# Patient Record
Sex: Male | Born: 1970
Health system: Southern US, Community
[De-identification: ages and names within clinical notes are randomized; demographics above are authoritative.]

## PROBLEM LIST (undated history)

## (undated) DIAGNOSIS — R931 Abnormal findings on diagnostic imaging of heart and coronary circulation: Secondary | ICD-10-CM

## (undated) DIAGNOSIS — E785 Hyperlipidemia, unspecified: Secondary | ICD-10-CM

## (undated) DIAGNOSIS — G4730 Sleep apnea, unspecified: Secondary | ICD-10-CM

## (undated) DIAGNOSIS — I10 Essential (primary) hypertension: Secondary | ICD-10-CM

## (undated) DIAGNOSIS — E119 Type 2 diabetes mellitus without complications: Secondary | ICD-10-CM

## (undated) DIAGNOSIS — E663 Overweight: Secondary | ICD-10-CM

## (undated) HISTORY — DX: Type 2 diabetes mellitus without complications: E11.9

## (undated) HISTORY — DX: Sleep apnea, unspecified: G47.30

## (undated) HISTORY — DX: Hyperlipidemia, unspecified: E78.5

## (undated) HISTORY — DX: Overweight: E66.3

## (undated) HISTORY — PX: FINGER SURGERY: SHX640

## (undated) HISTORY — PX: VASECTOMY: SHX75

## (undated) HISTORY — DX: Abnormal findings on diagnostic imaging of heart and coronary circulation: R93.1

---

## 2005-10-11 ENCOUNTER — Ambulatory Visit: Payer: Self-pay | Admitting: Internal Medicine

## 2005-10-18 ENCOUNTER — Ambulatory Visit: Payer: Self-pay | Admitting: Internal Medicine

## 2005-11-22 ENCOUNTER — Ambulatory Visit: Payer: Self-pay | Admitting: Internal Medicine

## 2006-01-21 ENCOUNTER — Ambulatory Visit: Payer: Self-pay | Admitting: Internal Medicine

## 2006-03-12 ENCOUNTER — Ambulatory Visit: Payer: Self-pay | Admitting: Internal Medicine

## 2006-08-05 ENCOUNTER — Ambulatory Visit: Payer: Self-pay | Admitting: Internal Medicine

## 2006-09-16 ENCOUNTER — Ambulatory Visit: Payer: Self-pay | Admitting: Internal Medicine

## 2007-05-02 DIAGNOSIS — I10 Essential (primary) hypertension: Secondary | ICD-10-CM

## 2010-03-13 ENCOUNTER — Emergency Department (HOSPITAL_COMMUNITY): Admission: EM | Admit: 2010-03-13 | Discharge: 2010-03-13 | Payer: Self-pay | Admitting: Emergency Medicine

## 2010-10-13 LAB — URINALYSIS, ROUTINE W REFLEX MICROSCOPIC
Bilirubin Urine: NEGATIVE
Specific Gravity, Urine: 1.028 (ref 1.005–1.030)
Urobilinogen, UA: 0.2 mg/dL (ref 0.0–1.0)

## 2010-10-13 LAB — DIFFERENTIAL
Lymphocytes Relative: 19 % (ref 12–46)
Lymphs Abs: 1.9 10*3/uL (ref 0.7–4.0)
Monocytes Relative: 8 % (ref 3–12)
Neutro Abs: 7.2 10*3/uL (ref 1.7–7.7)
Neutrophils Relative %: 72 % (ref 43–77)

## 2010-10-13 LAB — POCT CARDIAC MARKERS
Myoglobin, poc: 184 ng/mL (ref 12–200)
Troponin i, poc: <0.05 ng/mL (ref 0.00–0.09)

## 2010-10-13 LAB — POCT I-STAT, CHEM 8
BUN: 17 mg/dL (ref 6–23)
Chloride: 106 mEq/L (ref 96–112)
Creatinine, Ser: 1.1 mg/dL (ref 0.4–1.5)
Glucose, Bld: 87 mg/dL (ref 70–99)
HCT: 49 % (ref 39.0–52.0)
Hemoglobin: 16.7 g/dL (ref 13.0–17.0)
Potassium: 4.1 mEq/L (ref 3.5–5.1)
Sodium: 141 mEq/L (ref 135–145)

## 2010-10-13 LAB — CBC
HCT: 46.7 % (ref 39.0–52.0)
Hemoglobin: 16.2 g/dL (ref 13.0–17.0)
Platelets: 271 10*3/uL (ref 150–400)
RDW: 13 % (ref 11.5–15.5)

## 2011-03-28 ENCOUNTER — Other Ambulatory Visit: Payer: Self-pay | Admitting: Family Medicine

## 2011-03-28 DIAGNOSIS — R945 Abnormal results of liver function studies: Secondary | ICD-10-CM

## 2013-04-03 ENCOUNTER — Encounter: Payer: Self-pay | Admitting: Cardiology

## 2013-04-06 ENCOUNTER — Other Ambulatory Visit: Payer: Self-pay | Admitting: Family Medicine

## 2013-04-17 ENCOUNTER — Other Ambulatory Visit (HOSPITAL_COMMUNITY): Payer: Self-pay | Admitting: Family Medicine

## 2013-04-30 ENCOUNTER — Ambulatory Visit (HOSPITAL_COMMUNITY): Payer: 59

## 2013-05-01 ENCOUNTER — Encounter: Payer: Self-pay | Admitting: Cardiology

## 2013-05-01 ENCOUNTER — Ambulatory Visit (INDEPENDENT_AMBULATORY_CARE_PROVIDER_SITE_OTHER)
Admission: RE | Admit: 2013-05-01 | Discharge: 2013-05-01 | Disposition: A | Discharge status: Home / Self Care | Payer: Self-pay | Source: Ambulatory Visit | Attending: Cardiology | Admitting: Cardiology

## 2013-05-01 ENCOUNTER — Ambulatory Visit (INDEPENDENT_AMBULATORY_CARE_PROVIDER_SITE_OTHER): Payer: 59 | Admitting: Cardiology

## 2013-05-01 VITALS — BP 136/91 | HR 66 | Ht 69.0 in | Wt 253.0 lb

## 2013-05-01 DIAGNOSIS — R0609 Other forms of dyspnea: Secondary | ICD-10-CM

## 2013-05-01 DIAGNOSIS — R0600 Dyspnea, unspecified: Secondary | ICD-10-CM

## 2013-05-01 DIAGNOSIS — I10 Essential (primary) hypertension: Secondary | ICD-10-CM

## 2013-05-01 NOTE — Progress Notes (Signed)
HPI The patient has no prior cardiac history. However, he has a very strong family history of early onset coronary artery disease. I recently saw his mother post MI after knee surgery. He himself has not had any cardiovascular testing in the past.  And he is trying to be more active recently. He does some walking. He walks up an incline he will get short of breath. However, he does not describe chest pressure, neck or arm discomfort. He does not report palpitations, presyncope or syncope. He has had no PND or orthopnea. He has had no weight gain or edema and in fact he has lost weight through diet.  Not on File  Current Outpatient Prescriptions  Medication Sig Dispense Refill  . aspirin 81 MG tablet Take 81 mg by mouth daily.      Marland Kitchen atenolol (TENORMIN) 50 MG tablet Take 50 mg by mouth daily.      . Multiple Vitamins-Minerals (MULTIVITAMIN WITH MINERALS) tablet Take 1 tablet by mouth daily.      . pravastatin (PRAVACHOL) 10 MG tablet Take 10 mg by mouth daily.       No current facility-administered medications for this visit.    No past medical history on file.  No past surgical history on file.  No family history on file.  History   Social History  . Marital Status: Married    Spouse Name: N/A    Number of Children: N/A  . Years of Education: N/A   Occupational History  . Not on file.   Social History Main Topics  . Smoking status: Never Smoker   . Smokeless tobacco: Not on file  . Alcohol Use: Not on file  . Drug Use: Not on file  . Sexual Activity: Not on file   Other Topics Concern  . Not on file   Social History Narrative  . No narrative on file    ROS:  As stated in the HPI and negative for all other systems.  PHYSICAL EXAM BP 136/91  Pulse 66  Ht 5\' 9"  (1.753 m)  Wt 253 lb (114.76 kg)  BMI 37.34 kg/m2 GENERAL:  Well appearing HEENT:  Pupils equal round and reactive, fundi not visualized, oral mucosa unremarkable NECK:  No jugular venous distention,  waveform within normal limits, carotid upstroke brisk and symmetric, no bruits, no thyromegaly LYMPHATICS:  No cervical, inguinal adenopathy LUNGS:  Clear to auscultation bilaterally BACK:  No CVA tenderness CHEST:  Unremarkable HEART:  PMI not displaced or sustained,S1 and S2 within normal limits, no S3, no S4, no clicks, no rubs, no murmurs ABD:  Flat, positive bowel sounds normal in frequency in pitch, no bruits, no rebound, no guarding, no midline pulsatile mass, no hepatomegaly, no splenomegaly EXT:  2 plus pulses throughout, no edema, no cyanosis no clubbing SKIN:  No rashes no nodules NEURO:  Cranial nerves II through XII grossly intact, motor grossly intact throughout PSYCH:  Cognitively intact, oriented to person place and time   EKG:  Sinus rhythm, rate 66, LAD, intervals within normal limits, no acute ST-T wave changes.  LAFB.  05/01/2013  ASSESSMENT AND PLAN  DYSPNEA:  This is unlikely to represent obstructive coronary disease. However, he has significant cardiovascular risk factors. I will bring the patient back for a POET (Plain Old Exercise Test). This will allow me to screen for obstructive coronary disease, risk stratify and very importantly provide a prescription for exercise.  I would like to also bring him back for a coronary calcium  score to further risk stratify.  OVERWEIGHT:  We discussed this at length.  The patient understands the need to lose weight with diet and exercise. We have discussed specific strategies for this.  DYSLIPIDEMIA:   He will get me a copy of his lipid profile. We will treat based on the results above.

## 2013-05-01 NOTE — Patient Instructions (Addendum)
The current medical regimen is effective;  continue present plan and medications.  Your physician has requested that you have an exercise tolerance test. For further information please visit [email or web address] Please also follow instruction sheet, as given.  Cardiac CA scanning, (CAT scanning), is a noninvasive, special x-ray that produces cross-sectional images of the body using x-rays and a computer. CT scans help physicians diagnose and treat medical conditions. For some CT exams, a contrast material is used to enhance visibility in the area of the body being studied. CT scans provide greater clarity and reveal more details than regular x-ray exams.

## 2013-05-15 ENCOUNTER — Ambulatory Visit: Payer: Self-pay | Admitting: Cardiology

## 2013-06-05 ENCOUNTER — Encounter: Payer: Self-pay | Admitting: Nurse Practitioner

## 2013-06-05 ENCOUNTER — Ambulatory Visit (INDEPENDENT_AMBULATORY_CARE_PROVIDER_SITE_OTHER): Payer: 59 | Admitting: Nurse Practitioner

## 2013-06-05 VITALS — BP 145/99 | HR 73

## 2013-06-05 DIAGNOSIS — R0600 Dyspnea, unspecified: Secondary | ICD-10-CM

## 2013-06-05 DIAGNOSIS — R0609 Other forms of dyspnea: Secondary | ICD-10-CM

## 2013-06-05 DIAGNOSIS — I10 Essential (primary) hypertension: Secondary | ICD-10-CM

## 2013-06-05 NOTE — Patient Instructions (Signed)
See back in one year.

## 2013-06-05 NOTE — Progress Notes (Signed)
Exercise Treadmill Test  Pre-Exercise Testing Evaluation Rhythm: normal sinus  Rate: 60 bpm     Test  Exercise Tolerance Test Ordering MD: Angelina Sheriff, MD  Interpreting MD: Tyrone Sage, NP  Unique Test No: 1  Treadmill:  1  Indication for ETT: exertional dyspnea  Contraindication to ETT: No   Stress Modality: exercise - treadmill  Cardiac Imaging Performed: non   Protocol: standard Bruce - maximal  Max BP:  209/70  Max MPHR (bpm):  176 85% MPR (bpm):  150  MPHR obtained (bpm):  136 % MPHR obtained:  77%  Reached 85% MPHR (min:sec):  NA Total Exercise Time (min-sec):  10:30  Workload in METS:  12.5 Borg Scale: 16  Reason ETT Terminated:  patient's desire to stop    ST Segment Analysis At Rest: normal ST segments - no evidence of significant ST depression With Exercise: no evidence of significant ST depression  Other Information Arrhythmia:  No Angina during ETT:  absent (0) Quality of ETT:  non-diagnostic  ETT Interpretation:  normal - no evidence of ischemia by ST analysis  Comments: Patient presents today for routine GXT. Has had recent visit for dyspnea - positive FH for early CAD. He tells me that he is not having shortness of breath. No chest pain. Feels ok with no complaints.   Today the patient exercised on the standard Bruce protocol for a total of 10:30 minutes.  Good exercise tolerance.  Adequate blood pressure response.  Clinically negative for chest pain. Test was stopped due to bilateral calf pain. No chest pain. Not short of breath.  EKG negative for ischemia. No significant arrhythmia noted.  The test is felt to be non diagnostic due to target heart rate not being achieved (77% of predicted)  Recommendations: He has no EKG changes. He took his Atenolol yesterday morning. I think he is ok from our standpoint.  Would encourage him to continue CV risk factor modification.  See back in one year. He will be sending in lipids for Dr. Antoine Poche to review.   Patient  is agreeable to this plan and will call if any problems develop in the interim.   Rosalio Macadamia, RN, ANP-C [facility] [address] [place], [place]  [address]

## 2013-11-13 ENCOUNTER — Ambulatory Visit: Payer: 59 | Admitting: Dietician

## 2013-11-27 ENCOUNTER — Encounter: Payer: 59 | Attending: Family Medicine | Admitting: Dietician

## 2013-11-27 DIAGNOSIS — Z713 Dietary counseling and surveillance: Secondary | ICD-10-CM | POA: Insufficient documentation

## 2013-11-27 DIAGNOSIS — Z8249 Family history of ischemic heart disease and other diseases of the circulatory system: Secondary | ICD-10-CM | POA: Insufficient documentation

## 2013-11-27 DIAGNOSIS — E669 Obesity, unspecified: Secondary | ICD-10-CM | POA: Insufficient documentation

## 2013-11-27 DIAGNOSIS — E785 Hyperlipidemia, unspecified: Secondary | ICD-10-CM | POA: Insufficient documentation

## 2013-11-27 NOTE — Progress Notes (Signed)
  Medical Nutrition Therapy:  Appt start time: 1030 end time:  1130.   Assessment:  Primary concerns today:  Marcus Dickson is here today because he is interested in lifestyle change. Wants to be healthier and lose 50 pounds.  He has a family history of stroke, high BP, and heart attack. No male in his family has lived past age 45. Interested in preventative maintenance. 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 Very active at work. Non-smoker, rare drinker. Sleeps from 3:30 AM - 7:30 AM. Tries to keep a normal schedule. Married for 21 years, 2 children. Likes weight training over cardio.   Preferred Learning Style:   No preference indicated   Learning Readiness:  Ready  MEDICATIONS: see list   DIETARY INTAKE:  Loves sweets.   24-hr recall:  8:30 AM: 1-2 cups of coffee with vanilla creamer and 2 packets of sugar  2-1 PM: Bowl of soup or hummus with green olives on flatbread  7-10 PM: Chicken sandwiches or [place]key wrap from [employer] or fast food 2 AM: protein drink with Vanilla Almond milk OR soup or hummus  Beverages: coffee with cream and sugar, sweet tea or Marcus Dickson, soda sometimes  Usual physical activity: none  Estimated energy needs: 2200 calories 248 g carbohydrates 165 g protein 61 g fat  Progress Towards Goal(s):  No progress.   Nutritional Diagnosis:  [place]-2.2 Altered nutrition-related laboratory As related to obesity and inappropriate food choices.  As evidenced by hyperlipidemia.    Intervention:  Nutrition counseling provided. Goals: -Goal: lose 50 pounds  -Healthy rate of weight loss = 1-2 pound loss per week -Increase physical activity  -Walking/jogging (outside or treadmill) - listen to music that pumps you up! -Limit treats - Have them occasionally  -Avoid keeping them in the house!  -Have fruit to curb sweet cravings  -Include a carbohydrate and a protein with snacks and meals (refer to list) -Limit saturated fats: fatty cuts of meat, full-fat dairy, fried  foods, butter (shoot for <3g per serving) -Increase unsaturated fats: Olive oil, fish, nuts -Increase high fiber foods: fruits and vegetables and whole grains -Increase water intake (add fruit to flavor; cut up fruits ahead of time) -Choose low fat products (cheese, milk, sour cream, yogurt)  Teaching Method Utilized: Visual Auditory  Handouts given during visit include:  15g CHO + protein snacks  Barriers to learning/adherence to lifestyle change: none  Demonstrated degree of understanding via:  Teach Back   Monitoring/Evaluation:  Dietary intake, exercise, labs, and body weight in 3 month(s).

## 2013-11-27 NOTE — Patient Instructions (Addendum)
-  Goal: lose 50 pounds  -Healthy rate of weight loss = 1-2 pound loss per week  -Increase physical activity  -Walking/jogging (outside or treadmill) - listen to music that pumps you up!  -Limit treats - Have them occasionally  -Avoid keeping them in the house!  -Have fruit to curb sweet cravings   -Include a carbohydrate and a protein with snacks and meals (refer to list)  -Limit saturated fats: fatty cuts of meat, full-fat dairy, fried foods, butter (shoot for <3g per serving) -Increase unsaturated fats: Olive oil, fish, nuts  -Increase high fiber foods: fruits and vegetables and whole grains -Increase water intake (add fruit to flavor; cut up fruits ahead of time)  -Choose low fat products (cheese, milk, sour cream, yogurt)

## 2014-03-12 ENCOUNTER — Ambulatory Visit: Payer: 59 | Admitting: Dietician

## 2014-05-07 ENCOUNTER — Ambulatory Visit: Payer: 59 | Admitting: Dietician

## 2014-06-18 ENCOUNTER — Ambulatory Visit (INDEPENDENT_AMBULATORY_CARE_PROVIDER_SITE_OTHER): Payer: 59 | Admitting: Cardiology

## 2014-06-18 ENCOUNTER — Encounter: Payer: Self-pay | Admitting: Cardiology

## 2014-06-18 VITALS — BP 138/88 | HR 57 | Ht 69.0 in | Wt 258.0 lb

## 2014-06-18 DIAGNOSIS — E785 Hyperlipidemia, unspecified: Secondary | ICD-10-CM

## 2014-06-18 DIAGNOSIS — R938 Abnormal findings on diagnostic imaging of other specified body structures: Secondary | ICD-10-CM

## 2014-06-18 DIAGNOSIS — I2510 Atherosclerotic heart disease of native coronary artery without angina pectoris: Secondary | ICD-10-CM

## 2014-06-18 DIAGNOSIS — R931 Abnormal findings on diagnostic imaging of heart and coronary circulation: Secondary | ICD-10-CM

## 2014-06-18 LAB — LIPID PANEL
CHOL/HDL RATIO: 5.4 ratio
CHOLESTEROL: 174 mg/dL (ref 0–200)
HDL: 32 mg/dL — AB (ref >39)
LDL CALC: 99 mg/dL (ref 0–99)
Triglycerides: 217 mg/dL — ABNORMAL HIGH (ref <150)
VLDL: 43 mg/dL — ABNORMAL HIGH (ref 0–40)

## 2014-06-18 NOTE — Patient Instructions (Signed)
Your physician recommends that you schedule a follow-up appointment in: as needed  We are ordering  A treadmill test

## 2014-06-18 NOTE — Progress Notes (Signed)
   HPI The patient has a history of coronary calcium.  We found this on screening because he has a very strong family history of early onset coronary artery disease. He had a negative POET (Plain Old Exercise Treadmill).  After our first visit last year he did lose 30 pounds and exercise and started eating right. However, he had some backsliding and gained most of his weight back and hasn't been exercising as much. The patient denies any new symptoms such as chest discomfort, neck or arm discomfort. There has been no new shortness of breath, PND or orthopnea. There have been no reported palpitations, presyncope or syncope.   Not on File  Current Outpatient Prescriptions  Medication Sig Dispense Refill  . aspirin 81 MG tablet Take 81 mg by mouth daily.    Marland Kitchen. atenolol (TENORMIN) 50 MG tablet Take 50 mg by mouth daily.    . Multiple Vitamins-Minerals (MULTIVITAMIN WITH MINERALS) tablet Take 1 tablet by mouth daily.    . pravastatin (PRAVACHOL) 10 MG tablet Take 10 mg by mouth daily.     No current facility-administered medications for this visit.    Past Medical History  Diagnosis Date  . Dyslipidemia     Low HDL  . Overweight   . Dyspnea     Past Surgical History  Procedure Laterality Date  . Finger surgery    . Vasectomy      ROS:  As stated in the HPI and negative for all other systems.  PHYSICAL EXAM BP 138/88 mmHg  Pulse 57  Ht 5\' 9"  (1.753 m)  Wt 258 lb (117.028 kg)  BMI 38.08 kg/m2 GENERAL:  Well appearing NECK:  No jugular venous distention, waveform within normal limits, carotid upstroke brisk and symmetric, no bruits, no thyromegaly LUNGS:  Clear to auscultation bilaterally CHEST:  Unremarkable HEART:  PMI not displaced or sustained,S1 and S2 within normal limits, no S3, no S4, no clicks, no rubs, no murmurs ABD:  Flat, positive bowel sounds normal in frequency in pitch, no bruits, no rebound, no guarding, no midline pulsatile mass, no hepatomegaly, no splenomegaly,  obese EXT:  2 plus pulses throughout, no edema, no cyanosis no clubbing  EKG:  Sinus rhythm, rate 57, LAD, intervals within normal limits, no acute ST-T wave changes.  LAFB.  06/18/2014  ASSESSMENT AND PLAN  CORONARY CALCIUM:   I will bring the patient back for a POET (Plain Old Exercise Test). This will allow me to screen for obstructive coronary disease, risk stratify and very importantly provide a prescription for exercise.  I would like to also bring him back for a coronary calcium score to further risk stratify.  OVERWEIGHT:  We discussed this at length again today.  The patient understands the need to lose weight with diet and exercise. We have discussed specific strategies for this.  DYSLIPIDEMIA:    His LDL last year was slightly above 100.  I will repeat this with a goal LDL of 70.

## 2014-06-29 ENCOUNTER — Other Ambulatory Visit: Payer: Self-pay | Admitting: *Deleted

## 2014-07-01 ENCOUNTER — Other Ambulatory Visit: Payer: Self-pay | Admitting: *Deleted

## 2014-07-01 DIAGNOSIS — E785 Hyperlipidemia, unspecified: Secondary | ICD-10-CM

## 2014-07-01 MED ORDER — PRAVASTATIN SODIUM 40 MG PO TABS: 40.0000 mg | ORAL_TABLET | Freq: Every evening | ORAL | Status: DC

## 2014-07-14 ENCOUNTER — Telehealth (HOSPITAL_COMMUNITY): Payer: Self-pay

## 2014-07-14 NOTE — Telephone Encounter (Signed)
Encounter complete. 

## 2014-07-15 ENCOUNTER — Telehealth (HOSPITAL_COMMUNITY): Payer: Self-pay

## 2014-07-15 NOTE — Telephone Encounter (Signed)
Encounter complete. 

## 2014-07-16 ENCOUNTER — Encounter (HOSPITAL_COMMUNITY): Payer: 59

## 2014-08-03 ENCOUNTER — Telehealth (HOSPITAL_COMMUNITY): Payer: Self-pay | Admitting: *Deleted

## 2014-08-04 ENCOUNTER — Telehealth: Payer: Self-pay | Admitting: Cardiology

## 2014-08-04 NOTE — Telephone Encounter (Signed)
Pt called in wanting to see if he could come in to see Dr. Antoine PocheHochrein within the next 2 wks because he will be leaving to go out of town for work for 2 months. Please call  Thanks

## 2014-08-04 NOTE — Telephone Encounter (Signed)
Returned call to patient he stated he will be leaving town and would like appointment with Dr.Hochrein.Stated he had to cancel stress test appointment.Stated he needed to talk to Dr.Hochrein about some things.Appointment scheduled with Dr.Hochrein 08/09/14 at 12:15 pm.

## 2014-08-06 ENCOUNTER — Encounter (INDEPENDENT_AMBULATORY_CARE_PROVIDER_SITE_OTHER): Payer: Self-pay | Admitting: Ophthalmology

## 2014-08-09 ENCOUNTER — Ambulatory Visit: Payer: 59 | Admitting: Cardiology

## 2014-08-13 ENCOUNTER — Encounter (HOSPITAL_COMMUNITY): Payer: 59

## 2014-08-16 ENCOUNTER — Telehealth (HOSPITAL_COMMUNITY): Payer: Self-pay | Admitting: *Deleted

## 2014-08-27 ENCOUNTER — Other Ambulatory Visit (HOSPITAL_COMMUNITY): Payer: Self-pay | Admitting: Orthopaedic Surgery

## 2014-08-27 DIAGNOSIS — M25562 Pain in left knee: Secondary | ICD-10-CM

## 2014-09-04 ENCOUNTER — Ambulatory Visit (HOSPITAL_BASED_OUTPATIENT_CLINIC_OR_DEPARTMENT_OTHER): Payer: 59

## 2014-09-17 ENCOUNTER — Ambulatory Visit (INDEPENDENT_AMBULATORY_CARE_PROVIDER_SITE_OTHER): Payer: 59 | Admitting: Cardiology

## 2014-09-17 ENCOUNTER — Encounter: Payer: Self-pay | Admitting: Cardiology

## 2014-09-17 VITALS — BP 138/90 | HR 60 | Ht 69.0 in | Wt 255.0 lb

## 2014-09-17 DIAGNOSIS — E785 Hyperlipidemia, unspecified: Secondary | ICD-10-CM

## 2014-09-17 DIAGNOSIS — R0600 Dyspnea, unspecified: Secondary | ICD-10-CM

## 2014-09-17 DIAGNOSIS — R931 Abnormal findings on diagnostic imaging of heart and coronary circulation: Secondary | ICD-10-CM | POA: Insufficient documentation

## 2014-09-17 DIAGNOSIS — I2510 Atherosclerotic heart disease of native coronary artery without angina pectoris: Secondary | ICD-10-CM

## 2014-09-17 NOTE — Patient Instructions (Signed)
Your physician recommends that you schedule a follow-up appointment in: as needed with Dr. Antoine PocheHochrein  We are ordering lab test and a stress test for you to get done

## 2014-09-17 NOTE — Progress Notes (Signed)
   HPI The patient has a history of coronary calcium.  We found this on screening because he has a very strong family history of early onset coronary artery disease. He had a negative POET (Plain Old Exercise Treadmill) in 2014.  He was supposed to get this repeated recently but he had to cancel. He's not been having any new symptoms since I last saw him.Marland Kitchen.  He denies chest discomfort, neck or arm discomfort. There has been no new shortness of breath, PND or orthopnea. There have been no reported palpitations, presyncope or syncope.  He is doing some exercising. He says he is dieting and lost 8 pounds although not according to our scales.   Not on File  Current Outpatient Prescriptions  Medication Sig Dispense Refill  . aspirin 81 MG tablet Take 81 mg by mouth daily.    Marland Kitchen. atenolol (TENORMIN) 50 MG tablet Take 50 mg by mouth daily.    . Multiple Vitamins-Minerals (MULTIVITAMIN WITH MINERALS) tablet Take 1 tablet by mouth daily.    . pravastatin (PRAVACHOL) 40 MG tablet Take 1 tablet (40 mg total) by mouth every evening. 90 tablet 3   No current facility-administered medications for this visit.    Past Medical History  Diagnosis Date  . Dyslipidemia     Low HDL  . Overweight   . Dyspnea     Past Surgical History  Procedure Laterality Date  . Finger surgery    . Vasectomy      ROS:  ED:  Otherwise as stated in the HPI and negative for all other systems.  PHYSICAL EXAM BP 138/90 mmHg  Pulse 60  Ht 5\' 9"  (1.753 m)  Wt 255 lb (115.667 kg)  BMI 37.64 kg/m2 GENERAL:  Well appearing NECK:  No jugular venous distention, waveform within normal limits, carotid upstroke brisk and symmetric, no bruits, no thyromegaly LUNGS:  Clear to auscultation bilaterally CHEST:  Unremarkable HEART:  PMI not displaced or sustained,S1 and S2 within normal limits, no S3, no S4, no clicks, no rubs, no murmurs ABD:  Flat, positive bowel sounds normal in frequency in pitch, no bruits, no rebound, no  guarding, no midline pulsatile mass, no hepatomegaly, no splenomegaly, obese EXT:  2 plus pulses throughout, no edema, no cyanosis no clubbing  EKG:  Sinus rhythm, rate 60, LAD, intervals within normal limits, no acute ST-T wave changes.  LAFB.  09/17/2014  ASSESSMENT AND PLAN  CORONARY CALCIUM:   The patient has no active symptoms. He had to reschedule his POET (Plain Old Exercise Treadmill).  I will arrange this for next week.    OVERWEIGHT:  We weight loss again.    DYSLIPIDEMIA:    His LDL was 99.  This was before pravastatin. I will now repeat this. The goal is less than 70.  ED:  I would give him a prescription for Viagra once he has passed his stress test.

## 2014-09-22 ENCOUNTER — Telehealth (HOSPITAL_COMMUNITY): Payer: Self-pay

## 2014-09-22 NOTE — Telephone Encounter (Signed)
Encounter complete. 

## 2014-09-24 ENCOUNTER — Ambulatory Visit (HOSPITAL_COMMUNITY)
Admission: RE | Admit: 2014-09-24 | Discharge: 2014-09-24 | Disposition: A | Discharge status: Home / Self Care | Payer: 59 | Source: Ambulatory Visit | Attending: Cardiology | Admitting: Cardiology

## 2014-09-24 DIAGNOSIS — R0600 Dyspnea, unspecified: Secondary | ICD-10-CM | POA: Diagnosis not present

## 2014-09-24 NOTE — Procedures (Signed)
Exercise Treadmill Test  Test  Exercise Tolerance Test Ordering MD: Angelina SheriffJake Hochrein, MD  Interpreting MD:   Unique Test No: 1  Treadmill:  1  Indication for ZOX:[id number]:dyspnea Contraindication to ETT: No   Stress Modality: exercise - treadmill  Cardiac Imaging Performed: non   Protocol: standard Bruce - maximal  Max BP:  186/84  Max MPHR (bpm):  175 85% MPR (bpm):  148  MPHR obtained (bpm):  153 % MPHR obtained:  87  Reached 85% MPHR (min:sec):  9:50 Total Exercise Time (min-sec): 10:00  Workload in METS: 11.90 Borg Scale:   Reason ETT Terminated:  dyspnea, fatigue    ST Segment Analysis At Rest: NSR, right superior axis, no ST changes With Exercise: no evidence of significant ST depression  Other Information Arrhythmia:  No Angina during ETT:  absent (0) Quality of ETT:  diagnostic  ETT Interpretation:  normal - no evidence of ischemia by ST analysis  Comments: ETT with good exercise tolerance (10:00); no chest pain; normal BP response; no ST changes; Duke TM score-11; negative adequate ETT.  Marcus MillersBrian Dickson

## 2014-09-25 LAB — LIPID PANEL
CHOLESTEROL: 159 mg/dL (ref 0–200)
HDL: 30 mg/dL — AB (ref >=40)
LDL Cholesterol: 91 mg/dL (ref 0–99)
Total CHOL/HDL Ratio: 5.3 Ratio
Triglycerides: 191 mg/dL — ABNORMAL HIGH (ref <150)
VLDL: 38 mg/dL (ref 0–40)

## 2014-09-28 ENCOUNTER — Other Ambulatory Visit: Payer: Self-pay | Admitting: *Deleted

## 2014-09-28 ENCOUNTER — Telehealth: Payer: Self-pay | Admitting: Cardiology

## 2014-09-28 DIAGNOSIS — E785 Hyperlipidemia, unspecified: Secondary | ICD-10-CM

## 2014-09-28 DIAGNOSIS — R931 Abnormal findings on diagnostic imaging of heart and coronary circulation: Secondary | ICD-10-CM

## 2014-09-28 MED ORDER — SILDENAFIL CITRATE 50 MG PO TABS: 50.0000 mg | ORAL_TABLET | Freq: Every day | ORAL | Status: DC | PRN

## 2014-09-28 MED ORDER — PRAVASTATIN SODIUM 80 MG PO TABS: 40.0000 mg | ORAL_TABLET | Freq: Every evening | ORAL | Status: DC

## 2014-09-28 MED ORDER — SILDENAFIL CITRATE 50 MG PO TABS: 6.0000 mg | ORAL_TABLET | Freq: Every day | ORAL | Status: DC | PRN

## 2014-09-28 MED ORDER — PRAVASTATIN SODIUM 80 MG PO TABS: 80.0000 mg | ORAL_TABLET | Freq: Every evening | ORAL | Status: DC

## 2014-09-28 NOTE — Telephone Encounter (Signed)
I attempted to call pt. About his viagra script but his voice mail has not been set up

## 2014-09-28 NOTE — Telephone Encounter (Signed)
Pt called in wanting to check in with Dr. Antoine PocheHochrein about the Viagra medication he is suppose to call in for the pt. Please f/u  Thanks

## 2014-12-06 ENCOUNTER — Other Ambulatory Visit: Payer: Self-pay | Admitting: *Deleted

## 2014-12-06 ENCOUNTER — Other Ambulatory Visit: Payer: Self-pay | Admitting: Cardiology

## 2014-12-06 MED ORDER — ATENOLOL 50 MG PO TABS: 50.0000 mg | ORAL_TABLET | Freq: Every day | ORAL | Status: DC

## 2014-12-06 MED ORDER — PRAVASTATIN SODIUM 80 MG PO TABS: 80.0000 mg | ORAL_TABLET | Freq: Every evening | ORAL | Status: DC

## 2014-12-06 NOTE — Telephone Encounter (Signed)
Pt is working out of town for a week,left his medicine. Please call in 5 days of his Atenolol and Provastatin. Please call this to [facility]-[phone]

## 2014-12-06 NOTE — Telephone Encounter (Signed)
Marcus Dickson is calling again  Because he has no medication , Please call .Marland Kitchen. Thanks

## 2014-12-06 NOTE — Telephone Encounter (Signed)
Atenolol 50 mg # 5  No refills called into [facility] in [place] . Pt states he forgot his medication at home and just needed 5 days worth

## 2014-12-06 NOTE — Telephone Encounter (Signed)
Refill submitted to patient's preferred pharmacy. Informed patient. Pt voiced understanding, no other stated concerns at this time.  

## 2014-12-08 ENCOUNTER — Other Ambulatory Visit: Payer: Self-pay | Admitting: *Deleted

## 2014-12-08 ENCOUNTER — Telehealth: Payer: Self-pay | Admitting: Cardiology

## 2014-12-08 MED ORDER — ATENOLOL 50 MG PO TABS: 50.0000 mg | ORAL_TABLET | Freq: Two times a day (BID) | ORAL | Status: DC

## 2014-12-08 NOTE — Telephone Encounter (Signed)
Marcus Dickson is calling because we called in his bp medication to [facility] [place]  in the takesbp meds three times a day . ( Atenelol) and the pharmacy only gave him 5 pills for 5 days , but he needs 9 more pills until he gets home .( He left hi medication at home and he is out of town .Marland Kitchen. Thanks

## 2014-12-13 ENCOUNTER — Other Ambulatory Visit: Payer: Self-pay | Admitting: *Deleted

## 2015-06-10 ENCOUNTER — Other Ambulatory Visit: Payer: Self-pay | Admitting: Cardiology

## 2015-06-10 NOTE — Telephone Encounter (Signed)
°*  STAT* If patient is at the pharmacy, call can be transferred to refill team.   1. Which medications need to be refilled? (please list name of each medication and dose if known) Atenolol   2. Which pharmacy/location (including street and city if local pharmacy) is medication to be sent to?[facility]     3. Do they need a 30 day or 90 day supply? 90

## 2015-06-13 MED ORDER — ATENOLOL 50 MG PO TABS: 50.0000 mg | ORAL_TABLET | Freq: Two times a day (BID) | ORAL | Status: DC

## 2015-06-13 NOTE — Telephone Encounter (Signed)
Pt's medication has been sent to pt's pharmacy. Confirmation received. °

## 2015-06-17 ENCOUNTER — Telehealth: Payer: Self-pay | Admitting: Cardiology

## 2015-06-17 MED ORDER — ATENOLOL 50 MG PO TABS: 50.0000 mg | ORAL_TABLET | Freq: Two times a day (BID) | ORAL | Status: DC

## 2015-06-17 NOTE — Telephone Encounter (Signed)
Pt's Rx was resent to his pharmacy. Confirmation received.

## 2015-06-17 NOTE — Addendum Note (Signed)
Addended by: Demetrios LollBARNARD, CATHY C on: 06/17/2015 02:37 PM   Modules accepted: Orders

## 2015-06-17 NOTE — Telephone Encounter (Signed)
Pt's refill resent to pt's pharmacy. Confirmation received.

## 2015-06-17 NOTE — Telephone Encounter (Signed)
°*  STAT* If patient is at the pharmacy, call can be transferred to refill team.   1. Which medications need to be refilled? (please list name of each medication and dose if known) Atenolol-should have been 270  2. Which pharmacy/location (including street and city if local pharmacy) is medication to be sent to?[facility]  3. Do they need a 30 day or 90 day supply? Need 90 more for this refiil and the remainder refill should be for 270

## 2015-09-01 MED FILL — ATENOLOL 50 MG TABLET: 50 | 90 days supply | Qty: 270 | Fill #0

## 2015-09-01 MED FILL — PRAVASTATIN SODIUM 80 MG TA: 80 | 90 days supply | Qty: 90 | Fill #3

## 2015-09-09 MED FILL — MELOXICAM 15 MG TABLET: 15 | 30 days supply | Qty: 30 | Fill #3

## 2015-09-23 ENCOUNTER — Ambulatory Visit: Payer: 59 | Admitting: Cardiology

## 2015-10-13 NOTE — Progress Notes (Signed)
   HPI The patient has a history of coronary calcium.  We found this on screening because he has a very strong family history of early onset coronary artery disease. He had a negative POET (Plain Old Exercise Treadmill) in 2016.  Since I last saw him he has done well.  The patient denies any new symptoms such as chest discomfort, neck or arm discomfort. There has been no new shortness of breath, PND or orthopnea. There have been no reported palpitations, presyncope or syncope.  He has had a knee injury and has not been quite as active as I would like in the last year.   No Known Allergies  Current Outpatient Prescriptions  Medication Sig Dispense Refill  . aspirin 81 MG tablet Take 81 mg by mouth daily.    Marland Kitchen. atenolol (TENORMIN) 50 MG tablet Take 1 tablet (50 mg total) by mouth 2 (two) times daily. One in am and two in pm 270 tablet 0  . meloxicam (MOBIC) 15 MG tablet Take 1 tablet by mouth as needed.  5  . Multiple Vitamins-Minerals (MULTIVITAMIN WITH MINERALS) tablet Take 1 tablet by mouth daily.    . pravastatin (PRAVACHOL) 80 MG tablet Take 1 tablet (80 mg total) by mouth every evening. 5 tablet 0  . sildenafil (VIAGRA) 50 MG tablet Take 1 tablet (50 mg total) by mouth daily as needed for erectile dysfunction. 10 tablet 3   No current facility-administered medications for this visit.    Past Medical History  Diagnosis Date  . Dyslipidemia     Low HDL  . Overweight   . Dyspnea     Past Surgical History  Procedure Laterality Date  . Finger surgery    . Vasectomy      ROS:  ED:  Otherwise as stated in the HPI and negative for all other systems.  PHYSICAL EXAM BP 136/102 mmHg  Pulse 64  Ht 5\' 9"  (1.753 m)  Wt 255 lb 4.8 oz (115.803 kg)  BMI 37.68 kg/m2 GENERAL:  Well appearing NECK:  No jugular venous distention, waveform within normal limits, carotid upstroke brisk and symmetric, no bruits, no thyromegaly LUNGS:  Clear to auscultation bilaterally CHEST:   Unremarkable HEART:  PMI not displaced or sustained,S1 and S2 within normal limits, no S3, no S4, no clicks, no rubs, no murmurs ABD:  Flat, positive bowel sounds normal in frequency in pitch, no bruits, no rebound, no guarding, no midline pulsatile mass, no hepatomegaly, no splenomegaly, obese EXT:  2 plus pulses throughout, no edema, no cyanosis no clubbing  EKG:  Sinus rhythm, rate 64, LAD, intervals within normal limits, no acute ST-T wave changes.  LAFB.  10/14/2015  ASSESSMENT AND PLAN  CORONARY CALCIUM:   The patient has no active symptoms.  I will schedule a POET (Plain Old Exercise Treadmill).  Given the extent of his coronary calcium is family history we are following him closely.  OVERWEIGHT:  We weight loss again.    DYSLIPIDEMIA:   I did increase his pravastatin last year try and get his LDL to 70 mark. He will come back and have a fasting lipid profile.

## 2015-10-14 ENCOUNTER — Encounter: Payer: Self-pay | Admitting: Cardiology

## 2015-10-14 ENCOUNTER — Ambulatory Visit (INDEPENDENT_AMBULATORY_CARE_PROVIDER_SITE_OTHER): Payer: 59 | Admitting: Cardiology

## 2015-10-14 VITALS — BP 136/102 | HR 64 | Ht 69.0 in | Wt 255.3 lb

## 2015-10-14 DIAGNOSIS — I2510 Atherosclerotic heart disease of native coronary artery without angina pectoris: Secondary | ICD-10-CM

## 2015-10-14 DIAGNOSIS — R938 Abnormal findings on diagnostic imaging of other specified body structures: Secondary | ICD-10-CM

## 2015-10-14 DIAGNOSIS — R931 Abnormal findings on diagnostic imaging of heart and coronary circulation: Secondary | ICD-10-CM

## 2015-10-14 MED ORDER — PRAVASTATIN SODIUM 80 MG PO TABS: 80.0000 mg | ORAL_TABLET | Freq: Every evening | ORAL | Status: DC

## 2015-10-14 NOTE — Patient Instructions (Signed)
Your physician wants you to follow-up in: 1 Year. You will receive a reminder letter in the mail two months in advance. If you don't receive a letter, please call our office to schedule the follow-up appointment.  Your physician has requested that you have an exercise tolerance test in 2 Months. For further information please visit [email or web address]. Please also follow instruction sheet, as given.  Your physician recommends that you return for lab work in: in 2 Months Fasting Lipids

## 2015-11-28 MED FILL — PREVIDENT 5000 BOOSTER PLUS: 1.1 | 30 days supply | Qty: 100 | Fill #0

## 2015-12-02 ENCOUNTER — Other Ambulatory Visit: Payer: Self-pay | Admitting: Cardiology

## 2015-12-02 MED FILL — ATENOLOL 50 MG TABLET: 50 | 90 days supply | Qty: 270 | Fill #0

## 2015-12-02 NOTE — Telephone Encounter (Signed)
Rx Refill

## 2015-12-16 ENCOUNTER — Inpatient Hospital Stay (HOSPITAL_COMMUNITY): Admission: RE | Admit: 2015-12-16 | Payer: 59 | Source: Ambulatory Visit

## 2016-01-05 MED FILL — PRAVASTATIN SODIUM 80 MG TA: 80 | 90 days supply | Qty: 90 | Fill #0

## 2016-03-16 DIAGNOSIS — R05 Cough: Secondary | ICD-10-CM | POA: Diagnosis not present

## 2016-03-16 MED FILL — AZITHROMYCIN 250 MG TABLET: 250 | 5 days supply | Qty: 6 | Fill #0

## 2016-03-16 MED FILL — BENZONATATE 200 MG CAPSULE: 200 | 10 days supply | Qty: 30 | Fill #0

## 2016-03-19 ENCOUNTER — Other Ambulatory Visit: Payer: Self-pay | Admitting: Cardiology

## 2016-03-19 MED FILL — ATENOLOL 50 MG TABLET: 50 | 30 days supply | Qty: 90 | Fill #0

## 2016-04-06 ENCOUNTER — Inpatient Hospital Stay (HOSPITAL_COMMUNITY): Admission: RE | Admit: 2016-04-06 | Payer: 59 | Source: Ambulatory Visit

## 2016-04-06 ENCOUNTER — Encounter (HOSPITAL_COMMUNITY): Payer: 59

## 2016-04-17 MED FILL — BENZONATATE 200 MG CAPSULE: 200 | 10 days supply | Qty: 30 | Fill #1

## 2016-04-23 MED FILL — ATENOLOL 50 MG TABLET: 50 | 30 days supply | Qty: 90 | Fill #1

## 2016-04-23 MED FILL — PRAVASTATIN SODIUM 80 MG TA: 80 | 90 days supply | Qty: 90 | Fill #1

## 2016-04-25 ENCOUNTER — Telehealth (HOSPITAL_COMMUNITY): Payer: Self-pay

## 2016-04-25 NOTE — Telephone Encounter (Signed)
Encounter complete. 

## 2016-04-27 ENCOUNTER — Ambulatory Visit (HOSPITAL_COMMUNITY)
Admission: RE | Admit: 2016-04-27 | Discharge: 2016-04-27 | Disposition: A | Discharge status: Home / Self Care | Payer: 59 | Source: Ambulatory Visit | Attending: Cardiology | Admitting: Cardiology

## 2016-04-27 ENCOUNTER — Telehealth: Payer: Self-pay | Admitting: Cardiology

## 2016-04-27 ENCOUNTER — Encounter: Payer: Self-pay | Admitting: Cardiovascular Disease

## 2016-04-27 DIAGNOSIS — R938 Abnormal findings on diagnostic imaging of other specified body structures: Secondary | ICD-10-CM | POA: Diagnosis not present

## 2016-04-27 DIAGNOSIS — I2510 Atherosclerotic heart disease of native coronary artery without angina pectoris: Secondary | ICD-10-CM

## 2016-04-27 DIAGNOSIS — R931 Abnormal findings on diagnostic imaging of heart and coronary circulation: Secondary | ICD-10-CM | POA: Insufficient documentation

## 2016-04-27 LAB — EXERCISE TOLERANCE TEST
CHL CUP MPHR: 174 {beats}/min
CHL CUP RESTING HR STRESS: 76 {beats}/min
CSEPEDS: 18 s
CSEPHR: 17 %
Estimated workload: 10.6 METS
Exercise duration (min): 9 min
Peak HR: 133 {beats}/min
RPE: 76

## 2016-04-27 MED ORDER — ATENOLOL 50 MG PO TABS: ORAL_TABLET | ORAL | 1 refills | Status: DC

## 2016-04-27 NOTE — Telephone Encounter (Signed)
Returned call and refilled med at pt request. No further concerns or needs at this time.

## 2016-04-27 NOTE — Telephone Encounter (Signed)
Patient is having problems with prescriptions for atenolol and pravastation.

## 2016-04-30 ENCOUNTER — Telehealth: Payer: Self-pay | Admitting: *Deleted

## 2016-04-30 DIAGNOSIS — R931 Abnormal findings on diagnostic imaging of heart and coronary circulation: Secondary | ICD-10-CM

## 2016-04-30 NOTE — Telephone Encounter (Signed)
GXT ordered for 1 Year

## 2016-04-30 NOTE — Telephone Encounter (Signed)
-----   Message from Rollene RotundaJames Hochrein, MD sent at 04/28/2016  5:25 PM EDT ----- No evidence of ischemia although this is a submaximal test.  He needs to continue his current therapy and see me in one year for repeat POET (Plain Old Exercise Treadmill).  Call [name] with the results and send results to Emeterio ReeveWOLTERS,SHARON A, MD

## 2016-06-20 MED FILL — ATENOLOL 50 MG TABLET: 50 | 30 days supply | Qty: 90 | Fill #2

## 2016-07-13 ENCOUNTER — Encounter (INDEPENDENT_AMBULATORY_CARE_PROVIDER_SITE_OTHER): Payer: 59 | Admitting: Ophthalmology

## 2016-07-13 ENCOUNTER — Encounter: Payer: Self-pay | Admitting: Physician Assistant

## 2016-07-13 ENCOUNTER — Telehealth: Payer: Self-pay | Admitting: Cardiology

## 2016-07-13 ENCOUNTER — Ambulatory Visit (INDEPENDENT_AMBULATORY_CARE_PROVIDER_SITE_OTHER): Payer: 59 | Admitting: Physician Assistant

## 2016-07-13 VITALS — BP 114/86 | HR 73 | Temp 98.3°F | Resp 18

## 2016-07-13 DIAGNOSIS — R7309 Other abnormal glucose: Secondary | ICD-10-CM

## 2016-07-13 DIAGNOSIS — E1165 Type 2 diabetes mellitus with hyperglycemia: Secondary | ICD-10-CM | POA: Diagnosis not present

## 2016-07-13 DIAGNOSIS — IMO0001 Reserved for inherently not codable concepts without codable children: Secondary | ICD-10-CM

## 2016-07-13 DIAGNOSIS — Z8249 Family history of ischemic heart disease and other diseases of the circulatory system: Secondary | ICD-10-CM

## 2016-07-13 DIAGNOSIS — R631 Polydipsia: Secondary | ICD-10-CM

## 2016-07-13 DIAGNOSIS — H35033 Hypertensive retinopathy, bilateral: Secondary | ICD-10-CM

## 2016-07-13 DIAGNOSIS — I10 Essential (primary) hypertension: Secondary | ICD-10-CM

## 2016-07-13 LAB — POCT CBC
Granulocyte percent: 61.2 %G (ref 37–80)
HCT, POC: 42.6 % — AB (ref 43.5–53.7)
HEMOGLOBIN: 15.5 g/dL (ref 14.1–18.1)
LYMPH, POC: 2.4 (ref 0.6–3.4)
MCH, POC: 29.5 pg (ref 27–31.2)
MCHC: 36.4 g/dL — AB (ref 31.8–35.4)
MCV: 81.2 fL (ref 80–97)
MID (cbc): 0.2 (ref 0–0.9)
MPV: 8.3 fL (ref 0–99.8)
POC Granulocyte: 4.2 (ref 2–6.9)
POC LYMPH PERCENT: 35.4 %L (ref 10–50)
POC MID %: 3.4 %M (ref 0–12)
Platelet Count, POC: 202 10*3/uL (ref 142–424)
RBC: 5.25 M/uL (ref 4.69–6.13)
RDW, POC: 12.6 %
WBC: 6.9 10*3/uL (ref 4.6–10.2)

## 2016-07-13 LAB — POCT GLYCOSYLATED HEMOGLOBIN (HGB A1C): Hemoglobin A1C: 12.9

## 2016-07-13 LAB — POCT URINALYSIS DIP (MANUAL ENTRY)
BILIRUBIN UA: NEGATIVE
Bilirubin, UA: NEGATIVE
Blood, UA: NEGATIVE
Glucose, UA: 500 — AB
LEUKOCYTES UA: NEGATIVE
Nitrite, UA: NEGATIVE
PH UA: 6
PROTEIN UA: NEGATIVE
SPEC GRAV UA: 1.01
UROBILINOGEN UA: 0.2

## 2016-07-13 LAB — GLUCOSE, POCT (MANUAL RESULT ENTRY)
POC GLUCOSE: 382 mg/dL — AB (ref 70–99)
POC GLUCOSE: 328 mg/dL — AB (ref 70–99)

## 2016-07-13 MED ORDER — GLIPIZIDE 5 MG PO TABS: 5.0000 mg | ORAL_TABLET | Freq: Two times a day (BID) | ORAL | 3 refills | Status: DC

## 2016-07-13 MED ORDER — METFORMIN HCL 1000 MG PO TABS: ORAL_TABLET | ORAL | 3 refills | Status: DC

## 2016-07-13 MED FILL — metFORMIN HCL 1000 MG TABS: 1000 | 90 days supply | Qty: 180 | Fill #0

## 2016-07-13 MED FILL — glipiZIDE 5 MG TABS: 5 | 30 days supply | Qty: 60 | Fill #0

## 2016-07-13 NOTE — Telephone Encounter (Signed)
Returned patient call-pt states he would like blood work ordered prior to appt on 12/26.  Pt reports "changes" in the last 2 weeks and is concerned.  Pt reports intermittent blurred vision, urinary frequency, increased thirst.  Pt concerned about diabetes, reports family hx of.  Advised patient to check CBG if able-unsure if wife has glucometer but will check blood sugar if able, advised if symptoms continue or increase then proceed to PCP or urgent care/ER to be evaluated.    Advised that message sent to MD Hochrein for lab orders if needed for scheduled OV on 12/26 and will return call.   Routed to MD.  Pt aware and verbalized understanding.

## 2016-07-13 NOTE — Patient Instructions (Addendum)
   IF you received an x-ray today, you will receive an invoice from [facility]. Please contact [facility] at [phone] with questions or concerns regarding your invoice.   IF you received labwork today, you will receive an invoice from [facility]. Please contact [facility] at [phone] with questions or concerns regarding your invoice.   Our billing staff will not be able to assist you with questions regarding bills from these companies.  You will be contacted with the lab results as soon as they are available. The fastest way to get your results is to activate your My Chart account. Instructions are located on the last page of this paperwork. If you have not heard from us regarding the results in 2 weeks, please contact this office.     Diabetes Mellitus and Food It is important for you to manage your blood sugar (glucose) level. Your blood glucose level can be greatly affected by what you eat. Eating healthier foods in the appropriate amounts throughout the day at about the same time each day will help you control your blood glucose level. It can also help slow or prevent worsening of your diabetes mellitus. Healthy eating may even help you improve the level of your blood pressure and reach or maintain a healthy weight. General recommendations for healthful eating and cooking habits include:  Eating meals and snacks regularly. Avoid going long periods of time without eating to lose weight.  Eating a diet that consists mainly of plant-based foods, such as fruits, vegetables, nuts, legumes, and whole grains.  Using low-heat cooking methods, such as baking, instead of high-heat cooking methods, such as deep frying.  Work with your dietitian to make sure you understand how to use the Nutrition Facts information on food labels. How can food affect me? Carbohydrates Carbohydrates affect your blood glucose level more than any other type of food. Your dietitian will help  you determine how many carbohydrates to eat at each meal and teach you how to count carbohydrates. Counting carbohydrates is important to keep your blood glucose at a healthy level, especially if you are using insulin or taking certain medicines for diabetes mellitus. Alcohol Alcohol can cause sudden decreases in blood glucose (hypoglycemia), especially if you use insulin or take certain medicines for diabetes mellitus. Hypoglycemia can be a life-threatening condition. Symptoms of hypoglycemia (sleepiness, dizziness, and disorientation) are similar to symptoms of having too much alcohol. If your health care provider has given you approval to drink alcohol, do so in moderation and use the following guidelines:  Women should not have more than one drink per day, and men should not have more than two drinks per day. One drink is equal to: ? 12 oz of beer. ? 5 oz of wine. ? 1 oz of hard liquor.  Do not drink on an empty stomach.  Keep yourself hydrated. Have water, diet soda, or unsweetened iced tea.  Regular soda, juice, and other mixers might contain a lot of carbohydrates and should be counted.  What foods are not recommended? As you make food choices, it is important to remember that all foods are not the same. Some foods have fewer nutrients per serving than other foods, even though they might have the same number of calories or carbohydrates. It is difficult to get your body what it needs when you eat foods with fewer nutrients. Examples of foods that you should avoid that are high in calories and carbohydrates but low in nutrients include:  Trans fats (most processed   foods list trans fats on the Nutrition Facts label).  Regular soda.  Juice.  Candy.  Sweets, such as cake, pie, doughnuts, and cookies.  Fried foods.  What foods can I eat? Eat nutrient-rich foods, which will nourish your body and keep you healthy. The food you should eat also will depend on several factors,  including:  The calories you need.  The medicines you take.  Your weight.  Your blood glucose level.  Your blood pressure level.  Your cholesterol level.  You should eat a variety of foods, including:  Protein. ? Lean cuts of meat. ? Proteins low in saturated fats, such as fish, egg whites, and beans. Avoid processed meats.  Fruits and vegetables. ? Fruits and vegetables that may help control blood glucose levels, such as apples, mangoes, and yams.  Dairy products. ? Choose fat-free or low-fat dairy products, such as milk, yogurt, and cheese.  Grains, bread, pasta, and rice. ? Choose whole grain products, such as multigrain bread, whole oats, and brown rice. These foods may help control blood pressure.  Fats. ? Foods containing healthful fats, such as nuts, avocado, olive oil, canola oil, and fish.  Does everyone with diabetes mellitus have the same meal plan? Because every person with diabetes mellitus is different, there is not one meal plan that works for everyone. It is very important that you meet with a dietitian who will help you create a meal plan that is just right for you. This information is not intended to replace advice given to you by your health care provider. Make sure you discuss any questions you have with your health care provider. Document Released: 04/12/2005 Document Revised: 12/22/2015 Document Reviewed: 06/12/2013 Elsevier Interactive Patient Education  2017 Elsevier Inc.  

## 2016-07-13 NOTE — Progress Notes (Signed)
07/14/2016 10:38 AM   DOB: [birth year] / MRN: [id number]  SUBJECTIVE:  Marcus Dickson is a 45 y.o. male presenting for for an elevated blood sugar.  reoperts for the last 2 weeks he has felt dehydrated and has been experiencing polyuria.  He associates excessive thirst. Denies any pain in his hands and feet.  Has been having some blurry vision. He has seen his cardiologist in the last week and everything was normal and this include a stress test and EKG.  Went to the eye doctor today and he blood sugar was check at 450.  He came straight from there to here.    Current Outpatient Prescriptions:  .  aspirin 81 MG tablet, Take 81 mg by mouth daily., Disp: , Rfl:  .  atenolol (TENORMIN) 50 MG tablet, TAKE 1 TABLET BY MOUTH EVERY MORNING AND 2 TABLETS EVERY EVENING, Disp: 270 tablet, Rfl: 1 .  meloxicam (MOBIC) 15 MG tablet, Take 1 tablet by mouth as needed., Disp: , Rfl: 5 .  Multiple Vitamins-Minerals (MULTIVITAMIN WITH MINERALS) tablet, Take 1 tablet by mouth daily., Disp: , Rfl:  .  pravastatin (PRAVACHOL) 80 MG tablet, Take 1 tablet (80 mg total) by mouth every evening., Disp: 90 tablet, Rfl: 3 .  sildenafil (VIAGRA) 50 MG tablet, Take 1 tablet (50 mg total) by mouth daily as needed for erectile dysfunction., Disp: 10 tablet, Rfl: 3  He has No Known Allergies.   He  has a past medical history of Dyslipidemia; High coronary artery calcium score; and Overweight.    He  reports that he has never smoked. He does not have any smokeless tobacco history on file. He reports that he does not drink alcohol or use drugs. He  reports that he currently engages in sexual activity. The patient  has a past surgical history that includes Finger surgery and Vasectomy.  His family history includes CAD (age of onset: 50) in his father; CAD (age of onset: 4) in his mother; Heart attack in his father and mother; Stroke in his father and paternal grandmother.  Review of Systems  Constitutional: Negative for  chills and fever.  Respiratory: Negative for shortness of breath.   Cardiovascular: Negative for chest pain and leg swelling.  Skin: Negative for itching and rash.  Neurological: Negative for dizziness.  Endo/Heme/Allergies: Negative for polydipsia.    The problem list and medications were reviewed and updated by myself where necessary and exist elsewhere in the encounter.   OBJECTIVE:  BP 114/86 (BP Location: Right Arm, Cuff Size: Large)   Pulse 73   Temp 98.3 F (36.8 C) (Oral)   Resp 18   SpO2 96%   Physical Exam  Constitutional: He is oriented to person, place, and time.  Cardiovascular: Normal rate and regular rhythm.   Pulmonary/Chest: Effort normal and breath sounds normal.  Abdominal: Soft. Bowel sounds are normal.  Musculoskeletal: Normal range of motion.  Neurological: He is alert and oriented to person, place, and time.  Skin: Skin is warm and dry.    Wt Readings from Last 3 Encounters:  10/14/15 255 lb 4.8 oz (115.8 kg)  09/17/14 255 lb (115.7 kg)  06/18/14 258 lb (117 kg)     Results for orders placed or performed in visit on 07/13/16 (from the past 72 hour(s))  POCT glucose (manual entry)     Status: Abnormal   Collection Time: 07/13/16  3:20 PM  Result Value Ref Range   POC Glucose 382 (A) 70 - 99 mg/dl  POCT urinalysis dipstick     Status: Abnormal   Collection Time: 07/13/16  3:32 PM  Result Value Ref Range   Color, UA yellow yellow   Clarity, UA clear clear   Glucose, UA =500 (A) negative   Bilirubin, UA negative negative   Ketones, POC UA negative negative   Spec Grav, UA 1.010    Blood, UA negative negative   pH, UA 6.0    Protein Ur, POC negative negative   Urobilinogen, UA 0.2    Nitrite, UA Negative Negative   Leukocytes, UA Negative Negative  POCT CBC     Status: Abnormal   Collection Time: 07/13/16  4:04 PM  Result Value Ref Range   WBC 6.9 4.6 - 10.2 K/uL   Lymph, poc 2.4 0.6 - 3.4   POC LYMPH PERCENT 35.4 10 - 50 %L   MID (cbc)  0.2 0 - 0.9   POC MID % 3.4 0 - 12 %M   POC Granulocyte 4.2 2 - 6.9   Granulocyte percent 61.2 37 - 80 %G   RBC 5.25 4.69 - 6.13 M/uL   Hemoglobin 15.5 14.1 - 18.1 g/dL   HCT, POC 42.6 (A) 43.5 - 53.7 %   MCV 81.2 80 - 97 fL   MCH, POC 29.5 27 - 31.2 pg   MCHC 36.4 (A) 31.8 - 35.4 g/dL   RDW, POC 12.6 %   Platelet Count, POC 202 142 - 424 K/uL   MPV 8.3 0 - 99.8 fL  POCT glycosylated hemoglobin (Hb A1C)     Status: None   Collection Time: 07/13/16  4:07 PM  Result Value Ref Range   Hemoglobin A1C 12.9   CMP14+EGFR     Status: Abnormal   Collection Time: 07/13/16  4:09 PM  Result Value Ref Range   Glucose 400 (H) 65 - 99 mg/dL   BUN 21 6 - 24 mg/dL   Creatinine, Ser 0.79 0.76 - 1.27 mg/dL   GFR calc non Af Amer 107 >59 mL/min/1.73   GFR calc Af Amer 124 >59 mL/min/1.73   BUN/Creatinine Ratio 27 (H) 9 - 20   Sodium 135 134 - 144 mmol/L   Potassium 4.5 3.5 - 5.2 mmol/L   Chloride 97 96 - 106 mmol/L   CO2 17 (L) 18 - 29 mmol/L   Calcium 9.6 8.7 - 10.2 mg/dL   Total Protein 7.5 6.0 - 8.5 g/dL   Albumin 4.4 3.5 - 5.5 g/dL   Globulin, Total 3.1 1.5 - 4.5 g/dL   Albumin/Globulin Ratio 1.4 1.2 - 2.2   Bilirubin Total 0.4 0.0 - 1.2 mg/dL   Alkaline Phosphatase 94 39 - 117 IU/L   AST 93 (H) 0 - 40 IU/L   ALT 137 (H) 0 - 44 IU/L  POCT glucose (manual entry)     Status: Abnormal   Collection Time: 07/13/16  4:51 PM  Result Value Ref Range   POC Glucose 328 (A) 70 - 99 mg/dl    No results found.  ASSESSMENT AND PLAN  Marcus Dickson was seen today for diabetes.  Diagnoses and all orders for this visit:  Elevated glucose: New onset diabetic presumed to be type 2.  1.6 liters of fluid and his sugar came down to 275.  Starting metformin and glipizide today.  Fasting blood sugar checks in the morning and anytime he is symptomatic.  Recheck in two weeks. ED if symptoms and sugar too high.   -     POCT urinalysis dipstick -  POCT glucose (manual entry) -     POCT CBC -     POCT glucose  (manual entry)  Excessive thirst -     CMP14+EGFR -     POCT glycosylated hemoglobin (Hb A1C)  Family history of early CAD: Will forward this note to his cardiologist Dr. Jenkins Rouge.    Uncontrolled type 2 diabetes mellitus without complication, without long-term current use of insulin (HCC) -     Insulin and C-Peptide  Diabetes Mellitus Type II: -     metFORMIN (GLUCOPHAGE) 1000 MG tablet; Take 1/2 tab in the morning and at night with food for one week.  After one week start taking a full tab in the morning and at night. -     glipiZIDE (GLUCOTROL) 5 MG tablet; Take 1 tablet (5 mg total) by mouth 2 (two) times daily before a meal.    The patient is advised to call or return to clinic if he does not see an improvement in symptoms, or to seek the care of the closest emergency department if he worsens with the above plan.   Philis Fendt, MHS, PA-C [facility] 07/14/2016 10:38 AM

## 2016-07-13 NOTE — Telephone Encounter (Signed)
New message  Pt call requesting to speak with RN. Pt states he would like to get an order in the system for lab work to complete before office visit on 12/26. Please call back to discuss

## 2016-07-13 NOTE — Telephone Encounter (Signed)
Returned call to patient-patient requested RN to speak to wife.  Wife reports she took her husbands blood sugar and it is 450.  Wife requesting for opthalmologist   (who she works for and took her husband to check CBG) to speak with Hochrein if able.    Spoke with MD Hochrein (DOD).  Unable to speak to opthalmologist at this time, advised to proceed to urgent care or ER.  Pt does not have PCP.  Wife verbalized understanding and will proceed to ER for evaluation.

## 2016-07-13 NOTE — Telephone Encounter (Signed)
Mr.Del is calling to give you his blood sugars . Please call

## 2016-07-14 LAB — CMP14+EGFR
A/G RATIO: 1.4 (ref 1.2–2.2)
ALT: 137 IU/L — ABNORMAL HIGH (ref 0–44)
AST: 93 IU/L — ABNORMAL HIGH (ref 0–40)
Albumin: 4.4 g/dL (ref 3.5–5.5)
Alkaline Phosphatase: 94 IU/L (ref 39–117)
BUN / CREAT RATIO: 27 — AB (ref 9–20)
BUN: 21 mg/dL (ref 6–24)
Bilirubin Total: 0.4 mg/dL (ref 0.0–1.2)
CALCIUM: 9.6 mg/dL (ref 8.7–10.2)
CO2: 17 mmol/L — ABNORMAL LOW (ref 18–29)
Chloride: 97 mmol/L (ref 96–106)
Creatinine, Ser: 0.79 mg/dL (ref 0.76–1.27)
GFR, EST AFRICAN AMERICAN: 124 mL/min/{1.73_m2} (ref >59)
GFR, EST NON AFRICAN AMERICAN: 107 mL/min/{1.73_m2} (ref >59)
GLOBULIN, TOTAL: 3.1 g/dL (ref 1.5–4.5)
Glucose: 400 mg/dL — ABNORMAL HIGH (ref 65–99)
POTASSIUM: 4.5 mmol/L (ref 3.5–5.2)
SODIUM: 135 mmol/L (ref 134–144)
TOTAL PROTEIN: 7.5 g/dL (ref 6.0–8.5)

## 2016-07-14 LAB — INSULIN AND C-PEPTIDE, SERUM
C-Peptide: 7.6 ng/mL — ABNORMAL HIGH (ref 1.1–4.4)
INSULIN: 54.5 u[IU]/mL — ABNORMAL HIGH (ref 2.6–24.9)

## 2016-07-16 ENCOUNTER — Telehealth: Payer: Self-pay

## 2016-07-16 DIAGNOSIS — R945 Abnormal results of liver function studies: Principal | ICD-10-CM

## 2016-07-16 DIAGNOSIS — R7989 Other specified abnormal findings of blood chemistry: Secondary | ICD-10-CM

## 2016-07-16 NOTE — Telephone Encounter (Signed)
Please send labs and ov notes to Dr. Ardyth HarpsSteve South   attn: Karoline Caldwellngie   Wife states her husband is responding well to the medication.  Thank you Deliah BostonMichael Clark for calling and checking on him.    Fax [phone]  [phone]

## 2016-07-18 ENCOUNTER — Telehealth: Payer: Self-pay

## 2016-07-18 DIAGNOSIS — E119 Type 2 diabetes mellitus without complications: Secondary | ICD-10-CM

## 2016-07-18 NOTE — Telephone Encounter (Signed)
Pt is in link to wellness program and will be in need of a referral to the nutrition diabetes education services    Please advise  [facility] to this Iverson AlaminLaura Greeson is the one calling if more info is needed you may call her at [phone]

## 2016-07-23 NOTE — Progress Notes (Signed)
HPI The patient has a history of coronary calcium.  We found this on screening because he has a very strong family history of early onset coronary artery disease. He had a negative POET (Plain Old Exercise Treadmill) in 2016.  He had a negative POET (Plain Old Exercise Treadmill) again this year but this was submaximal.  He returns for follow up.  Since I last saw him he has been diagnosed with DM.  I reviewed the labs and his  insulin level was significantly elevated. He also had elevated liver enzymes. He is now on Glucophage and glipizide. He denies any cardiovascular symptoms. He has changed his diet. The patient denies any new symptoms such as chest discomfort, neck or arm discomfort. There has been no new shortness of breath, PND or orthopnea. There have been no reported palpitations, presyncope or syncope.   No Known Allergies  Current Outpatient Prescriptions  Medication Sig Dispense Refill  . aspirin 81 MG tablet Take 81 mg by mouth daily.    Marland Kitchen. atenolol (TENORMIN) 50 MG tablet TAKE 1 TABLET BY MOUTH EVERY MORNING AND 2 TABLETS EVERY EVENING 270 tablet 1  . glipiZIDE (GLUCOTROL) 5 MG tablet Take 1 tablet (5 mg total) by mouth 2 (two) times daily before a meal. 60 tablet 3  . meloxicam (MOBIC) 15 MG tablet Take 1 tablet by mouth as needed.  5  . metFORMIN (GLUCOPHAGE) 1000 MG tablet Take 1/2 tab in the morning and at night with food for one week.  After one week start taking a full tab in the morning and at night. 180 tablet 3  . Multiple Vitamins-Minerals (MULTIVITAMIN WITH MINERALS) tablet Take 1 tablet by mouth daily.    . pravastatin (PRAVACHOL) 80 MG tablet Take 1 tablet (80 mg total) by mouth every evening. 90 tablet 3  . sildenafil (VIAGRA) 50 MG tablet Take 1 tablet (50 mg total) by mouth daily as needed for erectile dysfunction. 10 tablet 3   No current facility-administered medications for this visit.     Past Medical History:  Diagnosis Date  . Diabetes (HCC)   .  Dyslipidemia    Low HDL  . High coronary artery calcium score   . Overweight     Past Surgical History:  Procedure Laterality Date  . FINGER SURGERY    . VASECTOMY      ROS:  ED.  Otherwise as stated in the HPI and negative for all other systems.  PHYSICAL EXAM BP 128/82 (BP Location: Right Arm, Patient Position: Sitting, Cuff Size: Normal)   Pulse 60   Ht 5\' 9"  (1.753 m)   Wt 251 lb 6.4 oz (114 kg)   SpO2 97%   BMI 37.13 kg/m  GENERAL:  Well appearing NECK:  No jugular venous distention, waveform within normal limits, carotid upstroke brisk and symmetric, no bruits, no thyromegaly LUNGS:  Clear to auscultation bilaterally CHEST:  Unremarkable HEART:  PMI not displaced or sustained,S1 and S2 within normal limits, no S3, no S4, no clicks, no rubs, no murmurs ABD:  Flat, positive bowel sounds normal in frequency in pitch, no bruits, no rebound, no guarding, no midline pulsatile mass, no hepatomegaly, no splenomegaly, obese EXT:  2 plus pulses throughout, no edema, no cyanosis no clubbing  EKG:  Sinus rhythm, rate 60, axis within normal limits, intervals within normal limits, poor anterior R wave progression.  07/24/2016   Lab Results  Component Value Date   CHOL 159 09/24/2014   TRIG 191 (H) 09/24/2014  HDL 30 (L) 09/24/2014   LDLCALC 91 09/24/2014    ASSESSMENT AND PLAN  CORONARY CALCIUM:   The patient has no active symptoms. At the last visit he had a negative but submaximal POET (Plain Old Exercise Treadmill).  He has no symptoms.  I will repeat a POET (Plain Old Exercise Treadmill) next year. We talked at length about risk reduction. We talked at length about exercise and diet.  OVERWEIGHT:  We weight loss again.    DM:   He's going to follow with endocrinology. He has follow-up with his urgent care physician whom a diagnosis as well.  DYSLIPIDEMIA:   I will check a lipid profile today.  The goal will be an LDL in the 70s.

## 2016-07-24 ENCOUNTER — Encounter: Payer: Self-pay | Admitting: Cardiology

## 2016-07-24 ENCOUNTER — Ambulatory Visit (INDEPENDENT_AMBULATORY_CARE_PROVIDER_SITE_OTHER): Payer: 59 | Admitting: Cardiology

## 2016-07-24 VITALS — BP 128/82 | HR 60 | Ht 69.0 in | Wt 251.4 lb

## 2016-07-24 DIAGNOSIS — R931 Abnormal findings on diagnostic imaging of heart and coronary circulation: Secondary | ICD-10-CM

## 2016-07-24 DIAGNOSIS — Z79899 Other long term (current) drug therapy: Secondary | ICD-10-CM | POA: Diagnosis not present

## 2016-07-24 DIAGNOSIS — I10 Essential (primary) hypertension: Secondary | ICD-10-CM | POA: Diagnosis not present

## 2016-07-24 DIAGNOSIS — E138 Other specified diabetes mellitus with unspecified complications: Secondary | ICD-10-CM | POA: Diagnosis not present

## 2016-07-24 LAB — LIPID PANEL
CHOL/HDL RATIO: 3.8 ratio (ref <5.0)
Cholesterol: 141 mg/dL (ref <200)
HDL: 37 mg/dL — AB (ref >40)
LDL CALC: 78 mg/dL (ref <100)
Triglycerides: 128 mg/dL (ref <150)
VLDL: 26 mg/dL (ref <30)

## 2016-07-24 MED FILL — ATENOLOL 50 MG TABLET: 50 | 30 days supply | Qty: 90 | Fill #3

## 2016-07-24 NOTE — Telephone Encounter (Signed)
Please place referral if appropriate. Last seen by Deliah Bostonmichael clark, but I do see a different pcp listed too?

## 2016-07-24 NOTE — Patient Instructions (Addendum)
Medication Instructions:  Continue current medications  Labwork: Fasting Lipids Today   Testing/Procedures: None Ordered  Follow-Up: Your physician wants you to follow-up in: 6 Months. You will receive a reminder letter in the mail two months in advance. If you don't receive a letter, please call our office to schedule the follow-up appointment.   Any Other Special Instructions Will Be Listed Below (If Applicable).        HAPPY NEW YEAR   If you need a refill on your cardiac medications before your next appointment, please call your pharmacy.

## 2016-07-24 NOTE — Telephone Encounter (Signed)
I have placed a referral to diabetes education.  They should be calling him and I can discuss this with him this Thursday. Deliah BostonMichael Clark, MS, PA-C 11:19 AM, 07/24/2016

## 2016-07-24 NOTE — Addendum Note (Signed)
Addended by: Barrie DunkerHOMAS, Jarmaine Ehrler N on: 07/24/2016 03:24 PM   Modules accepted: Orders

## 2016-07-25 NOTE — Telephone Encounter (Signed)
NEED PATIENT TO COME IN FOR ADDTIONAL LABS ORDERED BY PROVIDER  LEFT MESSAGE 07-25-16 SHAY

## 2016-07-26 ENCOUNTER — Ambulatory Visit (INDEPENDENT_AMBULATORY_CARE_PROVIDER_SITE_OTHER): Payer: 59 | Admitting: Physician Assistant

## 2016-07-26 ENCOUNTER — Encounter: Payer: Self-pay | Admitting: Physician Assistant

## 2016-07-26 VITALS — BP 134/86 | HR 69 | Temp 98.8°F | Resp 16 | Ht 69.0 in | Wt 253.0 lb

## 2016-07-26 DIAGNOSIS — E1165 Type 2 diabetes mellitus with hyperglycemia: Secondary | ICD-10-CM

## 2016-07-26 DIAGNOSIS — Z23 Encounter for immunization: Secondary | ICD-10-CM

## 2016-07-26 DIAGNOSIS — IMO0001 Reserved for inherently not codable concepts without codable children: Secondary | ICD-10-CM

## 2016-07-26 LAB — POCT URINALYSIS DIP (MANUAL ENTRY)
Bilirubin, UA: NEGATIVE
Leukocytes, UA: NEGATIVE
NITRITE UA: NEGATIVE
PROTEIN UA: NEGATIVE
RBC UA: NEGATIVE
Spec Grav, UA: 1.025
UROBILINOGEN UA: 0.2
pH, UA: 7

## 2016-07-26 LAB — GLUCOSE, POCT (MANUAL RESULT ENTRY): POC Glucose: 110 mg/dl — AB (ref 70–99)

## 2016-07-26 NOTE — Patient Instructions (Signed)
     IF you received an x-ray today, you will receive an invoice from [facility]. Please contact [facility] at [phone] with questions or concerns regarding your invoice.   IF you received labwork today, you will receive an invoice from [facility]. Please contact [facility] at [phone] with questions or concerns regarding your invoice.   Our billing staff will not be able to assist you with questions regarding bills from these companies.  You will be contacted with the lab results as soon as they are available. The fastest way to get your results is to activate your My Chart account. Instructions are located on the last page of this paperwork. If you have not heard from us regarding the results in 2 weeks, please contact this office.     

## 2016-07-26 NOTE — Progress Notes (Signed)
07/26/2016 4:43 PM   DOB: 12/25/1970 / MRN: [id number]  SUBJECTIVE:  Marcus Dickson is a 45 y.o. male presenting for sugar recheck.  Reports his is doing much better and he been able to cut back on carbs. He is taking his medication without fail. He plans to go to diabetes education. He is trying to exercise via walking about 20 mins. He is check his sugar multiple times per day and reports the fasting is running roughly 125-150.  He has had one episode of hypoglycemia at 20 however he was asymptomatic and tells me "I was just hungry."  He has No Known Allergies.   He  has a past medical history of Diabetes ([place]); Dyslipidemia; High coronary artery calcium score; and Overweight.    He  reports that he has never smoked. He does not have any smokeless tobacco history on file. He reports that he does not drink alcohol or use drugs. He  reports that he currently engages in sexual activity. The patient  has a past surgical history that includes Finger surgery and Vasectomy.  His family history includes CAD (age of onset: 70) in his father; CAD (age of onset: 76) in his mother; Heart attack in his father and mother; Stroke in his father and paternal grandmother.  Review of Systems  Constitutional: Negative for chills and fever.  HENT: Negative for hearing loss.   Respiratory: Negative for cough and shortness of breath.   Cardiovascular: Negative for chest pain and leg swelling.  Musculoskeletal: Negative for myalgias.  Skin: Negative for itching and rash.  Neurological: Negative for dizziness.  Endo/Heme/Allergies: Negative for polydipsia.    The problem list and medications were reviewed and updated by myself where necessary and exist elsewhere in the encounter.   OBJECTIVE:  BP 134/86 (BP Location: Right Arm, Patient Position: Sitting, Cuff Size: Large)   Pulse 69   Temp 98.8 F (37.1 C)   Resp 16   Ht _0  (1.753 m)   Wt 253 lb (114.8 kg)   SpO2 98%   BMI 37.36 kg/m   Physical  Exam  Constitutional: He is oriented to person, place, and time. He appears well-developed and well-nourished.  HENT:  Mouth/Throat: No oropharyngeal exudate.  Eyes: EOM are normal. Pupils are equal, round, and reactive to light.  Cardiovascular: Normal rate and regular rhythm.   Pulmonary/Chest: Effort normal and breath sounds normal.  Abdominal: Soft. Bowel sounds are normal.  Musculoskeletal: Normal range of motion.  Neurological: He is alert and oriented to person, place, and time.  Skin: Skin is warm and dry.  Psychiatric: He has a normal mood and affect.    Results for orders placed or performed in visit on 07/26/16 (from the past 72 hour(s))  POCT urinalysis dipstick     Status: Abnormal   Collection Time: 07/26/16  4:16 PM  Result Value Ref Range   Color, UA yellow yellow   Clarity, UA clear clear   Glucose, UA =500 (A) negative   Bilirubin, UA negative negative   Ketones, POC UA small (15) (A) negative   Spec Grav, UA 1.025    Blood, UA negative negative   pH, UA 7.0    Protein Ur, POC negative negative   Urobilinogen, UA 0.2    Nitrite, UA Negative Negative   Leukocytes, UA Negative Negative  POCT glucose (manual entry)     Status: Abnormal   Collection Time: 07/26/16  4:33 PM  Result Value Ref Range   POC Glucose  110 (A) 70 - 99 mg/dl   Wt Readings from Last 3 Encounters:  07/26/16 253 lb (114.8 kg)  07/24/16 251 lb 6.4 oz (114 kg)  10/14/15 255 lb 4.8 oz (115.8 kg)   No results found.  ASSESSMENT AND PLAN  Jenna was seen today for follow-up.  Diagnoses and all orders for this visit:  Uncontrolled type 2 diabetes mellitus without complication, without long-term current use of insulin (HCC) -     Flu Vaccine QUAD 36+ mos IM -     Microalbumin, urine -     CMP14+EGFR -     Hemoglobin A1c -     HM Diabetes Foot Exam -     POCT glucose (manual entry) -     POCT urinalysis dipstick -     Ambulatory referral to Endocrinology  Need for  diphtheria-tetanus-pertussis (Tdap) vaccine -     Tdap vaccine greater than or equal to 7yo IM  Need for pneumococcal vaccination -     Pneumococcal polysaccharide vaccine 23-valent greater than or equal to 2yo subcutaneous/IM    The patient is advised to call or return to clinic if he does not see an improvement in symptoms, or to seek the care of the closest emergency department if he worsens with the above plan.   Philis Fendt, MHS, PA-C [facility] 07/26/2016 4:43 PM

## 2016-07-27 LAB — CMP14+EGFR
ALK PHOS: 72 IU/L (ref 39–117)
ALT: 293 IU/L — AB (ref 0–44)
AST: 166 IU/L — AB (ref 0–40)
Albumin/Globulin Ratio: 1.8 (ref 1.2–2.2)
Albumin: 4.5 g/dL (ref 3.5–5.5)
BILIRUBIN TOTAL: 0.2 mg/dL (ref 0.0–1.2)
BUN / CREAT RATIO: 14 (ref 9–20)
BUN: 12 mg/dL (ref 6–24)
CHLORIDE: 103 mmol/L (ref 96–106)
CO2: 22 mmol/L (ref 18–29)
Calcium: 9.6 mg/dL (ref 8.7–10.2)
Creatinine, Ser: 0.83 mg/dL (ref 0.76–1.27)
GFR calc Af Amer: 121 mL/min/{1.73_m2} (ref >59)
GFR calc non Af Amer: 105 mL/min/{1.73_m2} (ref >59)
GLUCOSE: 104 mg/dL — AB (ref 65–99)
Globulin, Total: 2.5 g/dL (ref 1.5–4.5)
Potassium: 4.4 mmol/L (ref 3.5–5.2)
Sodium: 142 mmol/L (ref 134–144)
Total Protein: 7 g/dL (ref 6.0–8.5)

## 2016-07-27 LAB — HEMOGLOBIN A1C
ESTIMATED AVERAGE GLUCOSE: 258 mg/dL
HEMOGLOBIN A1C: 10.6 % — AB (ref 4.8–5.6)

## 2016-07-27 LAB — MICROALBUMIN, URINE: MICROALBUM., U, RANDOM: 6.9 ug/mL

## 2016-07-30 NOTE — Telephone Encounter (Signed)
I've called patient twice now in an attempt to advise of elevated liver enzymes. Given the steep increase since starting diabetes medication this is likely 2/2 Glipizide, however he did have some inflammation of his liver previous to see me, thus he still needs work up.  Would like to get a scan of the RUQ and run acute hep panels. Another reasonable step would be to stop glipizide and start an SGLT2 as his kidney are normal. ill try to call him again tomorrow. Deliah BostonMichael Rudolph Dobler, MS, PA-C 8:15 PM, 07/30/2016

## 2016-07-30 NOTE — Progress Notes (Signed)
I've called patient twice to discuss his labs. Message left on the first call and voice mail full on 2nd call. Please get in touch with him and find out when would be a good time to call. Deliah BostonMichael Carolyne Whitsel, MS, PA-C 8:28 PM, 07/30/2016

## 2016-08-06 ENCOUNTER — Telehealth: Payer: Self-pay | Admitting: Cardiology

## 2016-08-06 MED FILL — glipiZIDE 5 MG TABS: 5 | 30 days supply | Qty: 60 | Fill #1

## 2016-08-06 NOTE — Telephone Encounter (Signed)
Spoke with pt letting him know that referral was put into Dr Sharl MaKerr Office

## 2016-08-06 NOTE — Telephone Encounter (Signed)
New message      Calling to see if Dr Antoine PocheHochrein referred him to Dr [facility].  He has not heard from anyone regarding an appt.  Please call

## 2016-08-07 MED ORDER — DAPAGLIFLOZIN PROPANEDIOL 5 MG PO TABS: 5.0000 mg | ORAL_TABLET | Freq: Every day | ORAL | 3 refills | Status: DC

## 2016-08-07 NOTE — Telephone Encounter (Signed)
Liver enzymes elevated off of baseline after starting diabetes regimen.  Stopping glipizide as literature suggest this is a common cause.  Starting [place] at 5 mg daily.  Will run hep panels and get a RUQ US. Deliah BostonMichael Janeen Watson, MS, PA-C 7:07 PM, 08/07/2016

## 2016-08-08 NOTE — Progress Notes (Signed)
Thank you Clydie BraunKaren. I spoke with him yesterday. Deliah BostonMichael Aijah Lattner, MS, PA-C 9:21 AM, 08/08/2016

## 2016-08-09 ENCOUNTER — Encounter: Payer: Self-pay | Admitting: Dietician

## 2016-08-09 ENCOUNTER — Other Ambulatory Visit: Payer: 59

## 2016-08-09 ENCOUNTER — Encounter: Payer: 59 | Attending: Family Medicine | Admitting: Dietician

## 2016-08-09 ENCOUNTER — Telehealth: Payer: Self-pay | Admitting: Emergency Medicine

## 2016-08-09 DIAGNOSIS — Z713 Dietary counseling and surveillance: Secondary | ICD-10-CM | POA: Diagnosis not present

## 2016-08-09 DIAGNOSIS — E119 Type 2 diabetes mellitus without complications: Secondary | ICD-10-CM | POA: Diagnosis not present

## 2016-08-09 DIAGNOSIS — R7989 Other specified abnormal findings of blood chemistry: Secondary | ICD-10-CM

## 2016-08-09 DIAGNOSIS — R945 Abnormal results of liver function studies: Principal | ICD-10-CM

## 2016-08-09 MED FILL — JARDIANCE 10 MG TABLET: 10 | 30 days supply | Qty: 30 | Fill #0

## 2016-08-09 NOTE — Progress Notes (Signed)
Patient was seen on 08/09/16 for the first of a series of three diabetes self-management courses at the [facility].  Patient Education Plan per assessed needs and concerns is to attend four course education program for Diabetes Self Management Education.  The following learning objectives were met by the patient during this class:  Describe diabetes  State some common risk factors for diabetes  Defines the role of glucose and insulin  Identifies type of diabetes and pathophysiology  Describe the relationship between diabetes and cardiovascular risk  State the members of the Healthcare Team  States the rationale for glucose monitoring  State when to test glucose  State their individual Target Range  State the importance of logging glucose readings  Describe how to interpret glucose readings  Identifies A1C target  Explain the correlation between A1c and eAG values  State symptoms and treatment of high blood glucose  State symptoms and treatment of low blood glucose  Explain proper technique for glucose testing  Identifies proper sharps disposal  Handouts given during class include:  Living Well with Diabetes book  Carb Counting and Meal Planning book  Meal Plan Card  Carbohydrate guide  Meal planning worksheet  Low Sodium Flavoring Tips  The diabetes portion plate  H8O to eAG Conversion Chart  Diabetes Medications  Diabetes Recommended Care Schedule  Support Group  Diabetes Success Plan  Core Class Satisfaction Survey  Follow-Up Plan:  Attend core 2

## 2016-08-10 ENCOUNTER — Encounter: Payer: Self-pay | Admitting: Physician Assistant

## 2016-08-10 DIAGNOSIS — E119 Type 2 diabetes mellitus without complications: Secondary | ICD-10-CM | POA: Insufficient documentation

## 2016-08-10 LAB — HEPATIC FUNCTION PANEL
ALBUMIN: 4.4 g/dL (ref 3.5–5.5)
ALK PHOS: 60 IU/L (ref 39–117)
ALT: 126 IU/L — ABNORMAL HIGH (ref 0–44)
AST: 57 IU/L — ABNORMAL HIGH (ref 0–40)
Bilirubin Total: 0.3 mg/dL (ref 0.0–1.2)
Bilirubin, Direct: 0.11 mg/dL (ref 0.00–0.40)
TOTAL PROTEIN: 7.4 g/dL (ref 6.0–8.5)

## 2016-08-10 LAB — HEPATITIS PANEL, ACUTE
HEP B C IGM: NEGATIVE
Hep A IgM: NEGATIVE
Hepatitis B Surface Ag: NEGATIVE

## 2016-08-10 LAB — PROTIME-INR
INR: 1 (ref 0.8–1.2)
Prothrombin Time: 10.7 s (ref 9.1–12.0)

## 2016-08-13 NOTE — Telephone Encounter (Signed)
PATIENT STATES HE CAME IN AND DID BLOOD WORK ON Thursday FOR Marcus Dickson. THEY DISCUSSED HIS MEDICINE HE TAKES FOR HIS DIABETES. HE SAID Marcus TOLD HIM TO CALL HIM BACK IF HE HAD ANY QUESTIONS. MR. Camba SAID HIS GLUCOSE NUMBERS ARE UP AND DOWN AND HE DOES NOT LIKE THE WAY HE FEELS. HE WOULD LIKE Marcus TO TELL HIM WHAT HE SHOULD DO NEXT AS FAR AS HIM STARTING THE JARDIANCE? BEST PHONE [phone] (CELL) PHARMACY CHOICE IS [facility].  MBC

## 2016-08-13 NOTE — Telephone Encounter (Signed)
farxiga not covered. Must have tried metformin,sulfonylurea,pioglitazone,or combo product containing 2 of above and failed to qualify. PA [phone]. Id [id number]  Please advise if change or PA

## 2016-08-13 NOTE — Telephone Encounter (Signed)
Hold off on Jardiance for now. The only bad number here is the 278.  He should continue to log  sugars and take metformin as prescribed. I will be seeing him back in about 2 weeks and we can review his numbers then also.

## 2016-08-13 NOTE — Telephone Encounter (Signed)
Patient was placed on jardiance. Marcus BostonMichael Gamal Todisco, MS, PA-C 9:08 AM, 08/13/2016

## 2016-08-13 NOTE — Telephone Encounter (Signed)
Off glipizide and numbers have been  examples  745 am 115 1242pm 138 540pm 69  (ate)  702 pm 278 1013pm 67  Average has been 114 this past week.  Has not started the jardiance and wants advice when and if to start

## 2016-08-14 NOTE — Telephone Encounter (Signed)
Pt advised of michael's note, will cal for an appt.

## 2016-08-16 ENCOUNTER — Ambulatory Visit: Payer: 59

## 2016-08-17 ENCOUNTER — Ambulatory Visit: Payer: 59 | Admitting: Dietician

## 2016-08-20 ENCOUNTER — Other Ambulatory Visit: Payer: 59

## 2016-08-23 ENCOUNTER — Encounter: Payer: 59 | Admitting: Dietician

## 2016-08-23 DIAGNOSIS — E119 Type 2 diabetes mellitus without complications: Secondary | ICD-10-CM

## 2016-08-23 DIAGNOSIS — Z713 Dietary counseling and surveillance: Secondary | ICD-10-CM | POA: Diagnosis not present

## 2016-08-23 MED FILL — ATENOLOL 50 MG TABLET: 50 | 30 days supply | Qty: 90 | Fill #4

## 2016-08-23 MED FILL — PRAVASTATIN SODIUM 80 MG TA: 80 | 90 days supply | Qty: 90 | Fill #2

## 2016-08-23 NOTE — Progress Notes (Signed)

## 2016-09-03 DIAGNOSIS — E669 Obesity, unspecified: Secondary | ICD-10-CM | POA: Diagnosis not present

## 2016-09-03 DIAGNOSIS — R740 Nonspecific elevation of levels of transaminase and lactic acid dehydrogenase [LDH]: Secondary | ICD-10-CM | POA: Diagnosis not present

## 2016-09-03 DIAGNOSIS — Z1389 Encounter for screening for other disorder: Secondary | ICD-10-CM | POA: Diagnosis not present

## 2016-09-03 DIAGNOSIS — E784 Other hyperlipidemia: Secondary | ICD-10-CM | POA: Diagnosis not present

## 2016-09-03 DIAGNOSIS — Z6835 Body mass index (BMI) 35.0-35.9, adult: Secondary | ICD-10-CM | POA: Diagnosis not present

## 2016-09-03 DIAGNOSIS — E1165 Type 2 diabetes mellitus with hyperglycemia: Secondary | ICD-10-CM | POA: Diagnosis not present

## 2016-09-13 ENCOUNTER — Encounter: Payer: 59 | Attending: Family Medicine

## 2016-09-13 DIAGNOSIS — E119 Type 2 diabetes mellitus without complications: Secondary | ICD-10-CM | POA: Insufficient documentation

## 2016-09-13 DIAGNOSIS — Z713 Dietary counseling and surveillance: Secondary | ICD-10-CM | POA: Insufficient documentation

## 2016-09-13 NOTE — Progress Notes (Signed)
Patient was seen on 09/13/16 for the third of a series of three diabetes self-management courses at the [facility].   Janene Madeira. State the amount of activity recommended for healthy living . Describe activities suitable for individual needs . Identify ways to regularly incorporate activity into daily life . Identify barriers to activity and ways to over come these barriers  Identify diabetes medications being personally used and their primary action for lowering glucose and possible side effects . Describe role of stress on blood glucose and develop strategies to address psychosocial issues . Identify diabetes complications and ways to prevent them  Explain how to manage diabetes during illness . Evaluate success in meeting personal goal . Establish 2-3 goals that they will plan to diligently work on until they return for the  2338-month follow-up visit  Goals:   I will count my carb choices at most meals and snacks  I will be active 30 minutes or more 5 times a week  I will take my diabetes medications as scheduled  I will test my glucose at least 3 times a day, 7 days a week  Your patient has identified these potential barriers to change:  None  Your patient has identified their diabetes self-care support plan as  Family Support Plan:  Attend Support Group as desired

## 2016-09-23 MED FILL — metFORMIN HCL 1000 MG TABS: 1000 | 90 days supply | Qty: 180 | Fill #1

## 2016-09-23 MED FILL — ATENOLOL 50 MG TABLET: 50 | 30 days supply | Qty: 90 | Fill #5

## 2016-09-24 MED FILL — ACCU-CHEK GUIDE TEST STRIP: 50 days supply | Qty: 200 | Fill #0

## 2016-09-24 MED FILL — ACCU-CHEK FASTCLIX LANCETS: 51 days supply | Qty: 204 | Fill #0

## 2016-10-19 ENCOUNTER — Other Ambulatory Visit: Payer: Self-pay | Admitting: Cardiology

## 2016-10-19 MED FILL — ATENOLOL 50 MG TABLET: 50 | 30 days supply | Qty: 90 | Fill #0

## 2016-10-19 NOTE — Telephone Encounter (Signed)
REFILL 

## 2016-11-21 MED FILL — PRAVASTATIN SODIUM 80 MG TA: 80 | 30 days supply | Qty: 30 | Fill #0

## 2016-11-21 MED FILL — ATENOLOL 50 MG TABLET: 50 | 30 days supply | Qty: 90 | Fill #1

## 2016-12-25 MED FILL — metFORMIN HCL 1000 MG TABS: 1000 | 90 days supply | Qty: 180 | Fill #2

## 2016-12-25 MED FILL — PRAVASTATIN SODIUM 80 MG TA: 80 | 60 days supply | Qty: 60 | Fill #1

## 2016-12-25 MED FILL — ATENOLOL 50 MG TABLET: 50 | 90 days supply | Qty: 270 | Fill #2

## 2016-12-26 ENCOUNTER — Encounter: Payer: Self-pay | Admitting: Cardiology

## 2017-01-13 NOTE — Progress Notes (Signed)
HPI The patient has a history of coronary calcium.  We found this on screening because he has a very strong family history of early onset coronary artery disease. He had a negative POET (Plain Old Exercise Treadmill) in 2016.  He had a negative POET (Plain Old Exercise Treadmill) again last year but this was submaximal.  He returns for follow up.  He has lost 30 lbs!!!  He is eating right and exercising .  He feels much better.  The patient denies any new symptoms such as chest discomfort, neck or arm discomfort. There has been no new shortness of breath, PND or orthopnea. There have been no reported palpitations, presyncope or syncope.   No Known Allergies  Current Outpatient Prescriptions  Medication Sig Dispense Refill  . aspirin 81 MG tablet Take 81 mg by mouth daily.    Marland Kitchen atenolol (TENORMIN) 50 MG tablet TAKE 1 TABLET BY MOUTH EVERY MORNING AND 2 TABLETS EVERY EVENING 270 tablet 1  . meloxicam (MOBIC) 15 MG tablet Take 1 tablet by mouth as needed.  5  . metFORMIN (GLUCOPHAGE) 1000 MG tablet Take 1/2 tab in the morning and at night with food for one week.  After one week start taking a full tab in the morning and at night. 180 tablet 3  . Multiple Vitamins-Minerals (MULTIVITAMIN WITH MINERALS) tablet Take 1 tablet by mouth daily.    . pravastatin (PRAVACHOL) 80 MG tablet Take 1 tablet (80 mg total) by mouth every evening. KEEP OV. 90 tablet 0   No current facility-administered medications for this visit.     Past Medical History:  Diagnosis Date  . Diabetes (HCC)   . Dyslipidemia    Low HDL  . High coronary artery calcium score   . Overweight     Past Surgical History:  Procedure Laterality Date  . FINGER SURGERY    . VASECTOMY      ROS:  ED.  Otherwise as stated in the HPI and negative for all other systems.  PHYSICAL EXAM BP (!) 138/94   Pulse 65   Ht 5\' 9"  (1.753 m)   Wt 238 lb 9.6 oz (108.2 kg)   BMI 35.24 kg/m   GENERAL:  Well appearing NECK:  No jugular venous  distention, waveform within normal limits, carotid upstroke brisk and symmetric, no bruits, no thyromegaly LUNGS:  Clear to auscultation bilaterally CHEST:  Unremarkable HEART:  PMI not displaced or sustained,S1 and S2 within normal limits, no S3, no S4, no clicks, no rubs, no murmurs ABD:  Flat, positive bowel sounds normal in frequency in pitch, no bruits, no rebound, no guarding, no midline pulsatile mass, no hepatomegaly, no splenomegaly EXT:  2 plus pulses throughout, no edema, no cyanosis no clubbing   EKG:  Sinus rhythm, rate 65, axis within normal limits, intervals within normal limits, poor anterior R wave progression.  01/14/2017   Lab Results  Component Value Date   CHOL 141 07/24/2016   TRIG 128 07/24/2016   HDL 37 (L) 07/24/2016   LDLCALC 78 07/24/2016    ASSESSMENT AND PLAN  CORONARY CALCIUM:   The patient has  no active symptoms. He is participating in risk reduction and I am very proud of him.  I will follow this with a POET (Plain Old Exercise Treadmill) early next year.  He will continue with risk reduction.   OVERWEIGHT:  I am very proud of him for his weight loss.    DM:  He is seeing Dr. Evlyn Kanner and  his A1C was much lower.    DYSLIPIDEMIA:   I have asked him to get this checked by Dr. [facility].  I would like the LDL to be less than 70.

## 2017-01-14 ENCOUNTER — Ambulatory Visit (INDEPENDENT_AMBULATORY_CARE_PROVIDER_SITE_OTHER): Payer: 59 | Admitting: Cardiology

## 2017-01-14 ENCOUNTER — Encounter: Payer: Self-pay | Admitting: Cardiology

## 2017-01-14 VITALS — BP 138/94 | HR 65 | Ht 69.0 in | Wt 238.6 lb

## 2017-01-14 DIAGNOSIS — R931 Abnormal findings on diagnostic imaging of heart and coronary circulation: Secondary | ICD-10-CM

## 2017-01-14 DIAGNOSIS — E663 Overweight: Secondary | ICD-10-CM

## 2017-01-14 NOTE — Patient Instructions (Addendum)
Medication Instructions:  Continue current medications  Labwork: None Ordered  Testing/Procedures: Your physician has requested that you have an exercise tolerance test in February 2019. For further information please visit [email or web address]. Please also follow instruction sheet, as given.   Follow-Up: Your physician wants you to follow-up in: 1 Year. You will receive a reminder letter in the mail two months in advance. If you don't receive a letter, please call our office to schedule the follow-up appointment.   Any Other Special Instructions Will Be Listed Below (If Applicable).   If you need a refill on your cardiac medications before your next appointment, please call your pharmacy.

## 2017-02-15 DIAGNOSIS — Z6836 Body mass index (BMI) 36.0-36.9, adult: Secondary | ICD-10-CM | POA: Diagnosis not present

## 2017-02-15 DIAGNOSIS — Z1389 Encounter for screening for other disorder: Secondary | ICD-10-CM | POA: Diagnosis not present

## 2017-02-15 DIAGNOSIS — E784 Other hyperlipidemia: Secondary | ICD-10-CM | POA: Diagnosis not present

## 2017-02-15 DIAGNOSIS — E119 Type 2 diabetes mellitus without complications: Secondary | ICD-10-CM | POA: Diagnosis not present

## 2017-02-15 DIAGNOSIS — E669 Obesity, unspecified: Secondary | ICD-10-CM | POA: Diagnosis not present

## 2017-02-20 MED FILL — ACCU-CHEK FASTCLIX LANCETS: 51 days supply | Qty: 204 | Fill #1

## 2017-02-20 MED FILL — ACCU-CHEK GUIDE TEST STRIP: 50 days supply | Qty: 200 | Fill #1

## 2017-03-28 ENCOUNTER — Other Ambulatory Visit: Payer: Self-pay | Admitting: Cardiology

## 2017-03-28 MED FILL — metFORMIN HCL 1000 MG TABS: 1000 | 90 days supply | Qty: 180 | Fill #3

## 2017-03-28 MED FILL — PRAVASTATIN SODIUM 80 MG TA: 80 | 90 days supply | Qty: 90 | Fill #0

## 2017-04-02 ENCOUNTER — Other Ambulatory Visit: Payer: Self-pay | Admitting: *Deleted

## 2017-04-02 MED ORDER — ATENOLOL 50 MG PO TABS: ORAL_TABLET | ORAL | 1 refills | Status: DC

## 2017-04-02 MED FILL — ATENOLOL 50 MG TABLET: 50 | 90 days supply | Qty: 270 | Fill #0

## 2017-06-21 ENCOUNTER — Other Ambulatory Visit: Payer: Self-pay | Admitting: Physician Assistant

## 2017-06-21 MED FILL — ATENOLOL 50 MG TABLET: 50 | 90 days supply | Qty: 270 | Fill #1

## 2017-06-21 MED FILL — PRAVASTATIN SODIUM 80 MG TA: 80 | 90 days supply | Qty: 90 | Fill #1

## 2017-06-24 ENCOUNTER — Other Ambulatory Visit: Payer: Self-pay | Admitting: Physician Assistant

## 2017-06-24 DIAGNOSIS — E119 Type 2 diabetes mellitus without complications: Secondary | ICD-10-CM | POA: Diagnosis not present

## 2017-06-24 DIAGNOSIS — Z23 Encounter for immunization: Secondary | ICD-10-CM | POA: Diagnosis not present

## 2017-06-24 DIAGNOSIS — E7849 Other hyperlipidemia: Secondary | ICD-10-CM | POA: Diagnosis not present

## 2017-06-24 DIAGNOSIS — Z1389 Encounter for screening for other disorder: Secondary | ICD-10-CM | POA: Diagnosis not present

## 2017-06-24 DIAGNOSIS — E668 Other obesity: Secondary | ICD-10-CM | POA: Diagnosis not present

## 2017-06-24 DIAGNOSIS — R740 Nonspecific elevation of levels of transaminase and lactic acid dehydrogenase [LDH]: Secondary | ICD-10-CM | POA: Diagnosis not present

## 2017-06-24 DIAGNOSIS — Z6836 Body mass index (BMI) 36.0-36.9, adult: Secondary | ICD-10-CM | POA: Diagnosis not present

## 2017-06-24 MED FILL — METFORMIN HCL ER 500 MG TAB: 500 | 90 days supply | Qty: 180 | Fill #0

## 2017-06-24 MED FILL — BENZONATATE 100 MG CAPS: 100 | 5 days supply | Qty: 30 | Fill #0

## 2017-07-02 MED FILL — metFORMIN HCL 1000 MG TABS: 1000 | 90 days supply | Qty: 180 | Fill #0

## 2017-07-11 MED FILL — BENZONATATE 100 MG CAPS: 100 | 5 days supply | Qty: 30 | Fill #1

## 2017-07-25 DIAGNOSIS — E7849 Other hyperlipidemia: Secondary | ICD-10-CM | POA: Diagnosis not present

## 2017-07-25 DIAGNOSIS — R7989 Other specified abnormal findings of blood chemistry: Secondary | ICD-10-CM | POA: Diagnosis not present

## 2017-07-29 ENCOUNTER — Other Ambulatory Visit: Payer: Self-pay | Admitting: Endocrinology

## 2017-07-29 DIAGNOSIS — R7989 Other specified abnormal findings of blood chemistry: Secondary | ICD-10-CM

## 2017-07-29 DIAGNOSIS — R945 Abnormal results of liver function studies: Principal | ICD-10-CM

## 2017-08-22 MED FILL — BENZONATATE 100 MG CAPS: 100 | 5 days supply | Qty: 30 | Fill #2

## 2017-08-22 MED FILL — ACCU-CHEK GUIDE TEST STRIP: 50 days supply | Qty: 200 | Fill #2

## 2017-08-22 MED FILL — ACCU-CHEK FASTCLIX LANCETS: 51 days supply | Qty: 204 | Fill #2

## 2017-08-26 ENCOUNTER — Ambulatory Visit
Admission: RE | Admit: 2017-08-26 | Discharge: 2017-08-26 | Disposition: A | Discharge status: Home / Self Care | Payer: No Typology Code available for payment source | Source: Ambulatory Visit | Attending: Endocrinology | Admitting: Endocrinology

## 2017-08-26 DIAGNOSIS — R945 Abnormal results of liver function studies: Principal | ICD-10-CM

## 2017-08-26 DIAGNOSIS — R7989 Other specified abnormal findings of blood chemistry: Secondary | ICD-10-CM

## 2017-08-28 ENCOUNTER — Telehealth: Payer: Self-pay | Admitting: Cardiology

## 2017-08-28 NOTE — Telephone Encounter (Signed)
Returned the call to the patient. 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 He has an exercise tolerance test on 2/8 at which time he will bring his DOT forms. He was wondering if he needs an appointment to be seen as well. Last appointment was on 01/15/17. Message routed to Dr. Jenene SlickerHochrein's assistant.

## 2017-08-28 NOTE — Telephone Encounter (Signed)
Left a message to call back.

## 2017-08-28 NOTE — Telephone Encounter (Signed)
Follow up:    Returning your call from today.

## 2017-08-28 NOTE — Telephone Encounter (Signed)
New Message   Patient is calling in about getting a DOT clearance. He is unclear if he would need to come in for an actual physical or can he turn in the clearance form. Please call to discuss.

## 2017-09-04 ENCOUNTER — Telehealth (HOSPITAL_COMMUNITY): Payer: Self-pay

## 2017-09-04 NOTE — Telephone Encounter (Signed)
Encounter complete. 

## 2017-09-06 ENCOUNTER — Ambulatory Visit (HOSPITAL_COMMUNITY)
Admission: RE | Admit: 2017-09-06 | Discharge: 2017-09-06 | Disposition: A | Discharge status: Home / Self Care | Payer: No Typology Code available for payment source | Source: Ambulatory Visit | Attending: Cardiology | Admitting: Cardiology

## 2017-09-06 DIAGNOSIS — R931 Abnormal findings on diagnostic imaging of heart and coronary circulation: Secondary | ICD-10-CM | POA: Diagnosis not present

## 2017-09-06 LAB — EXERCISE TOLERANCE TEST
CSEPED: 9 min
CSEPEW: 11.3 METS
Exercise duration (sec): 44 s
MPHR: 172 {beats}/min
Peak HR: 134 {beats}/min
Percent HR: 77 %
RPE: 18
Rest HR: 80 {beats}/min

## 2017-09-26 ENCOUNTER — Other Ambulatory Visit: Payer: Self-pay | Admitting: Cardiology

## 2017-09-26 MED FILL — PRAVASTATIN SODIUM 80 MG TA: 80 | 90 days supply | Qty: 90 | Fill #2

## 2017-09-26 MED FILL — ATENOLOL 50 MG TABLET: 50 | 90 days supply | Qty: 270 | Fill #0

## 2017-09-26 MED FILL — metFORMIN HCL 1000 MG TABS: 1000 | 90 days supply | Qty: 180 | Fill #1

## 2017-09-26 NOTE — Telephone Encounter (Signed)
Rx(s) sent to pharmacy electronically.  

## 2017-11-07 MED FILL — BENZONATATE 100 MG CAPS: 100 | 30 days supply | Qty: 90 | Fill #0

## 2017-12-23 MED FILL — BENZONATATE 100 MG CAPS: 100 | 30 days supply | Qty: 90 | Fill #1

## 2018-01-06 MED FILL — metFORMIN HCL 1000 MG TABS: 1000 | 90 days supply | Qty: 180 | Fill #2

## 2018-01-06 MED FILL — PRAVASTATIN SODIUM 80 MG TA: 80 | 90 days supply | Qty: 90 | Fill #3

## 2018-01-06 MED FILL — ATENOLOL 50 MG TABLET: 50 | 90 days supply | Qty: 270 | Fill #1

## 2018-02-06 MED FILL — FREESTYLE LITE TEST STRIP: 25 days supply | Qty: 100 | Fill #0

## 2018-02-10 MED FILL — FREESTYLE FREEDOM LITE METE: W/DEVICE | 25 days supply | Qty: 1 | Fill #0

## 2018-02-10 MED FILL — PREVIDENT 5000 BOOSTER PLUS: 1.1 | 30 days supply | Qty: 100 | Fill #0

## 2018-02-10 MED FILL — FREESTYLE LANCETS: 25 days supply | Qty: 100 | Fill #0

## 2018-02-14 ENCOUNTER — Ambulatory Visit: Payer: No Typology Code available for payment source | Admitting: Cardiology

## 2018-02-17 ENCOUNTER — Ambulatory Visit: Payer: No Typology Code available for payment source | Admitting: Cardiology

## 2018-03-13 ENCOUNTER — Ambulatory Visit: Payer: No Typology Code available for payment source | Admitting: Neurology

## 2018-03-13 ENCOUNTER — Encounter: Payer: Self-pay | Admitting: Neurology

## 2018-03-13 VITALS — BP 157/102 | HR 80 | Ht 69.0 in | Wt 248.0 lb

## 2018-03-13 DIAGNOSIS — R0683 Snoring: Secondary | ICD-10-CM | POA: Diagnosis not present

## 2018-03-13 DIAGNOSIS — E119 Type 2 diabetes mellitus without complications: Secondary | ICD-10-CM

## 2018-03-13 DIAGNOSIS — I10 Essential (primary) hypertension: Secondary | ICD-10-CM | POA: Diagnosis not present

## 2018-03-13 DIAGNOSIS — G4726 Circadian rhythm sleep disorder, shift work type: Secondary | ICD-10-CM

## 2018-03-13 NOTE — Progress Notes (Signed)
SLEEP MEDICINE CLINIC   Provider:  Melvyn Novasarmen  Koryn Charlot, M D  Primary Care Physician:  Adrian PrinceSouth, Stephen, MD   Referring Provider: Adrian PrinceSouth, Stephen, MD    Chief Complaint  Patient presents with  . New Patient (Initial Visit)    RM 10, with daughter. Pt endorses snoring but has never had a sleep study. Pt is a DOT driver    HPI:  Marcus Dickson is a 47 y.o. 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 Evlyn KannerSouth for a sleep apnea evaluation.   Chief complaint according to patient : DOT physical. DM, HTN, Obesity.    Sleep habits are as follows: irregular work hours determine irregular sleep times.  He may work between 4 AM and until 10 PM, and he travels a lot with an Administrator, sportsinstallment crew.  Marcus Dickson is not primarily a driver but his company requires a driver for each work crew.  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   Whenever he can retreat to the bedroom he is asleep in 15 seconds and his wife reports he snores soon after - but not continuously, his wife tries to get him off his back. He can't sleep very long on his sides as his shoulders hurt. The bedroom is dark, cool at 68 F, quiet. He always wakes up at 7.30 AM while on the road or at home. - no matter when he went to bed. Even when not on the road he gets phone calls, but works mostly day shift.  He doesn't wake choking -   Average sleep duration at home 7 hours, on the road - anything possible. He wakes up congested and with a parched mouth, has a lot mucous.    Sleep medical history and family sleep history:  Snoring for several years , as he gained weight - he has lost 30 pounds since 2018 and is able to keep it off.  Retrognathia.  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 Non smoker, ETOH- seldomly .  Caffeine : 2 cups a day of coffee,  shift worker - swing shifts.    Review of Systems: Out of a complete 14 system review, the patient complains of only the following symptoms, and all other reviewed systems are negative.  Epworth score 3/ 24  , Fatigue severity score 16/ 63   , depression score-    Social History   Socioeconomic History  . Marital status: Married    Spouse name: Not on file  . Number of children: 2  . Years of education: Not on file  . Highest education level: Not on file  Occupational History  . Occupation: Teaching laboratory technicianroject manager    Employer: [employer]  Social Needs  . Financial resource strain: Not on file  . Food insecurity:    Worry: Not on file    Inability: Not on file  . Transportation needs:    Medical: Not on file    Non-medical: Not on file  Tobacco Use  . Smoking status: Never Smoker  . Smokeless tobacco: Never Used  Substance and Sexual Activity  . Alcohol use: No  . Drug use: No  . Sexual activity: Yes  Lifestyle  . Physical activity:    Days per week: Not on file  Minutes per session: Not on file  . Stress: Not on file  Relationships  . Social connections:    Talks on phone: Not on file    Gets together: Not on file    Attends religious service: Not on file    Active member of club or organization: Not on file    Attends meetings of clubs or organizations: Not on file    Relationship status: Not on file  . Intimate partner violence:    Fear of current or ex partner: Not on file    Emotionally abused: Not on file    Physically abused: Not on file    Forced sexual activity: Not on file  Other Topics Concern  . Not on file  Social History Narrative  . Not on file    Family History  Problem Relation Age of Onset  . CAD Father [id number]       Died MI - was a smoker  . Heart attack Father   . Stroke Father   . CAD Mother 2366       CABG; post op cardiac arrest during knee surgery   . Heart attack Mother   . Stroke Paternal Grandmother     Past Medical  History:  Diagnosis Date  . Diabetes (HCC)   . Dyslipidemia    Low HDL  . High coronary artery calcium score   . Overweight     Past Surgical History:  Procedure Laterality Date  . FINGER SURGERY    . VASECTOMY      Current Outpatient Medications  Medication Sig Dispense Refill  . aspirin 81 MG tablet Take 81 mg by mouth daily.    Marland Kitchen. atenolol (TENORMIN) 50 MG tablet TAKE 1 TABLET BY MOUTH EVERY MORNING AND 2 TABLETS EVERY EVENING 270 tablet 1  . meloxicam (MOBIC) 15 MG tablet Take 1 tablet by mouth as needed.  5  . metFORMIN (GLUCOPHAGE) 1000 MG tablet TAKE 1/2 TABLET BY MOUTH IN THE MORNING AND AT NIGHT WITH FOOD FOR 1 WEEK, THEN INCREASE TO 1 TAB TWICE DAILY THEREAFTER   Office visit needed 60 tablet 0  . Multiple Vitamins-Minerals (MULTIVITAMIN WITH MINERALS) tablet Take 1 tablet by mouth daily.    . pravastatin (PRAVACHOL) 80 MG tablet TAKE 1 TABLET (80 MG TOTAL) BY MOUTH EVERY EVENING (KEEP OFFICE VISIT) 90 tablet 3   No current facility-administered medications for this visit.     Allergies as of 03/13/2018  . (No Known Allergies)    Vitals: BP (!) 157/102   Pulse 80   Ht 5\' 9"  (1.753 m)   Wt 248 lb (112.5 kg)   BMI 36.62 kg/m  Last Weight:  Wt Readings from Last 1 Encounters:  03/13/18 248 lb (112.5 kg)   NWG:NFAOBMI:Body mass index is 36.62 kg/m.     Last Height:   Ht Readings from Last 1 Encounters:  03/13/18 5\' 9"  (1.753 m)    Physical exam:  General: The patient is awake, alert and appears not in acute distress. The patient is well groomed. Head: Normocephalic, atraumatic. Neck is supple. Mallampati 3 ,  neck circumference:20  Nasal airflow patent , Retrognathia is seen.  Cardiovascular:  Regular rate and rhythm , without  murmurs or carotid bruit, and without distended neck veins. Respiratory: Lungs are clear to auscultation. Skin:  Without evidence of edema, or rash Trunk: BMI is 36.6 . The patient's posture is erect.   Neurologic exam : The patient is  awake and alert, oriented to  place and time.   Memory subjective described as intact.   Attention span & concentration ability appears normal.  Speech is fluent,  without  dysarthria, dysphonia or aphasia.  Mood and affect are appropriate.  Cranial nerves: Pupils are equal and briskly reactive to light. Funduscopic exam deferred . Extraocular movements  in vertical and horizontal planes intact and without nystagmus. Visual fields by finger perimetry are intact. Hearing to finger rub intact. Facial sensation intact to fine touch. Facial motor strength is symmetric and tongue and uvula move midline. Shoulder shrug was symmetrical.   Motor exam:   Normal tone, muscle bulk and symmetric strength in all extremities. Sensory:  Fine touch, pinprick and vibration normal. Coordination: Rapid alternating movements / Finger-to-nose maneuver normal without evidence of ataxia, dysmetria or tremor. Gait and station: Patient walks without assistive device .Tandem gait is unfragmented. Turns with 3  Steps.  Deep tendon reflexes: in the upper and lower extremities are symmetric and intact.    Assessment:  After physical and neurologic examination, review of laboratory studies,  Personal review of imaging studies, reports of other /same  Imaging studies, results of polysomnography and / or neurophysiology testing and pre-existing records as far as provided in visit., my assessment is   1) Marcus Dickson is a 47 year old Caucasian right-handed gentleman who presents today for evaluation of possible presence of sleep apnea.  He needs this for his DOT recertification, the patient has no other neurologic or medical problems, he does have a family history positive for coronary artery disease in his father.  Heart attacks have affected both parents and a stroke was present in his father as well as his paternal grandmother.  Aside from snoring he is not feeling impaired in terms of sleeping but his wife has been  bothered by it.  He does have a larger neck size his BMI has risen also he is in the process of losing more weight.  Hypertension and diabetes have been well controlled.  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  He is neither excessively fatigued nor excessively daytime sleepy by his own assessment.  At this time we need a screening test for sleep apnea and a home sleep test to be sufficient.   It is also quicker to obtain.    I will ask for an ASAP home sleep test within the next 14 days, and the study should be marked as " urgent " read.   The patient was advised of the nature of the diagnosed disorder , the treatment options and the  risks for general health and wellness arising from not treating the condition.   I spent more than 45  minutes of face to face time with the patient. Greater than 50% of time was spent in counseling and coordination of care. We have discussed the diagnosis and differential and I answered the patient's questions.    Plan:  Treatment plan and additional workup : HST -   Farhad Burleson, MD 03/13/2018, 3:34 PM  Certified in Neurology by ABPN Certified in Sleep Medicine by [facility] [address], [address] [place], [place] [address]

## 2018-03-21 ENCOUNTER — Ambulatory Visit: Payer: No Typology Code available for payment source | Admitting: Cardiology

## 2018-04-01 MED FILL — SM BLOOD PRESSURE MONITOR: 30 days supply | Qty: 1 | Fill #0

## 2018-04-14 ENCOUNTER — Other Ambulatory Visit: Payer: Self-pay | Admitting: Cardiology

## 2018-04-14 MED FILL — PRAVASTATIN SODIUM 80 MG TA: 80 | 90 days supply | Qty: 90 | Fill #0

## 2018-04-14 MED FILL — ATENOLOL 50 MG TABLET: 50 | 90 days supply | Qty: 270 | Fill #0

## 2018-04-14 MED FILL — metFORMIN HCL 1000 MG TABS: 1000 | 90 days supply | Qty: 180 | Fill #0

## 2018-04-28 ENCOUNTER — Ambulatory Visit: Payer: No Typology Code available for payment source | Admitting: Cardiology

## 2018-04-30 ENCOUNTER — Ambulatory Visit (INDEPENDENT_AMBULATORY_CARE_PROVIDER_SITE_OTHER): Payer: No Typology Code available for payment source | Admitting: Neurology

## 2018-04-30 DIAGNOSIS — G4733 Obstructive sleep apnea (adult) (pediatric): Secondary | ICD-10-CM

## 2018-04-30 DIAGNOSIS — I10 Essential (primary) hypertension: Secondary | ICD-10-CM

## 2018-04-30 DIAGNOSIS — G4726 Circadian rhythm sleep disorder, shift work type: Secondary | ICD-10-CM

## 2018-04-30 DIAGNOSIS — R0683 Snoring: Secondary | ICD-10-CM

## 2018-04-30 DIAGNOSIS — E119 Type 2 diabetes mellitus without complications: Secondary | ICD-10-CM

## 2018-05-01 IMAGING — US US ABDOMEN LIMITED
1 series · 14 of 25 positions shown · non-contrast
Comparison: None.

CLINICAL DATA: Elevated LFTs

EXAM:
ULTRASOUND ABDOMEN LIMITED RIGHT UPPER QUADRANT

[Series 1: us abdomen limited · 0.26mm/px · 14 of 39 slices shown]
[im 1/39]
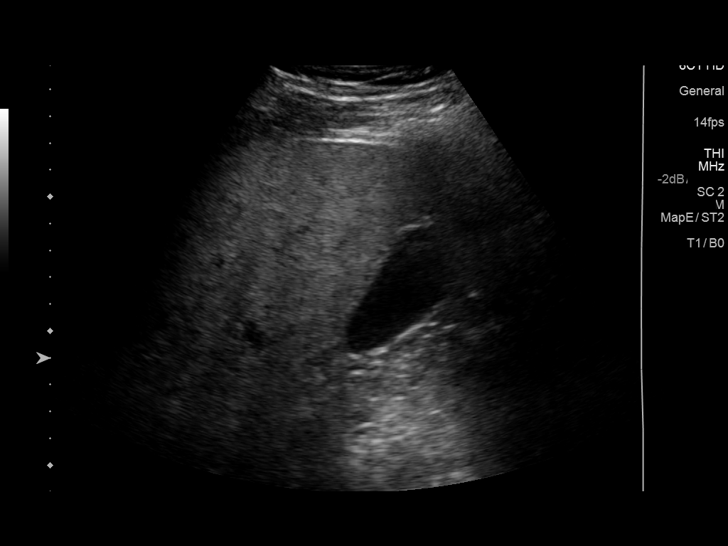
[im 4/39]
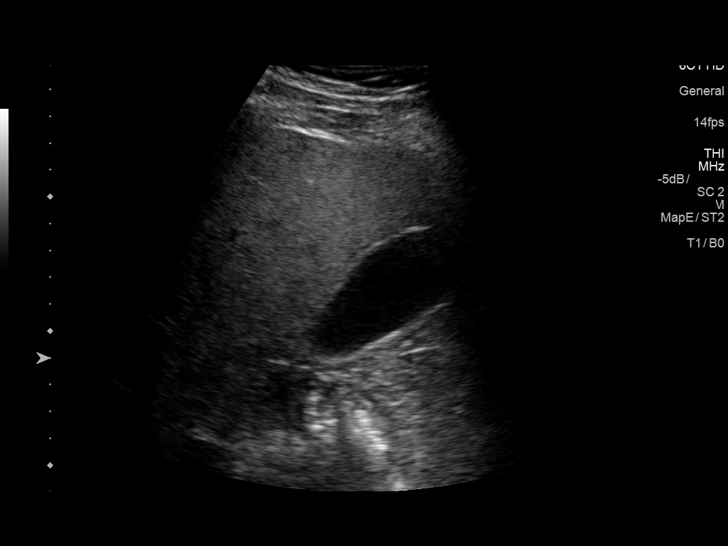
[im 7/39]
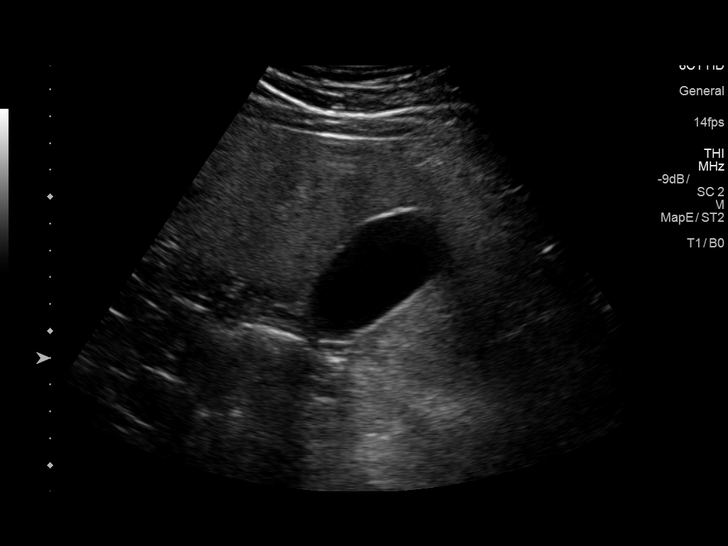
[im 10/39]
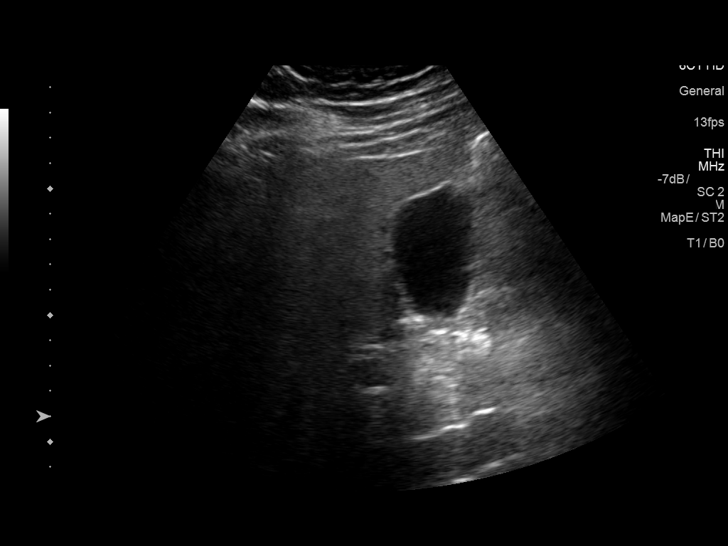
[im 13/39]
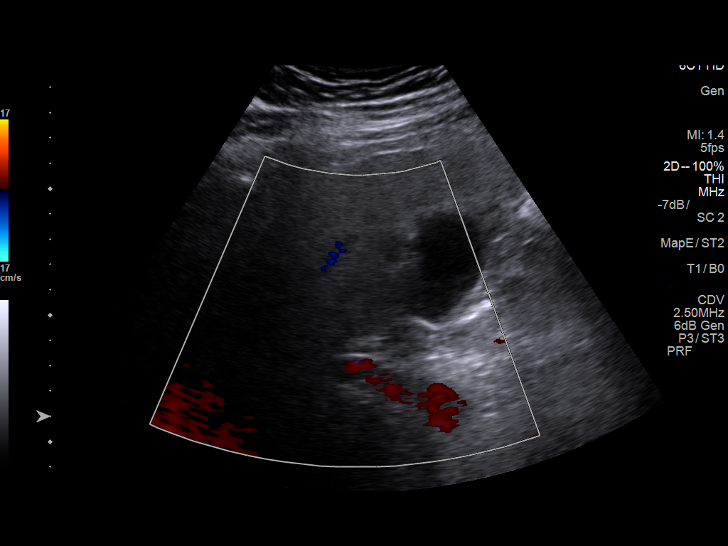
[im 15/39]
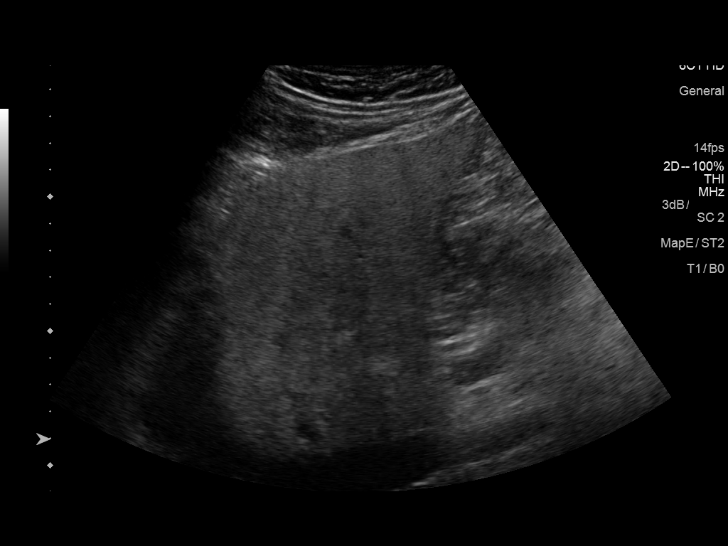
[im 18/39]
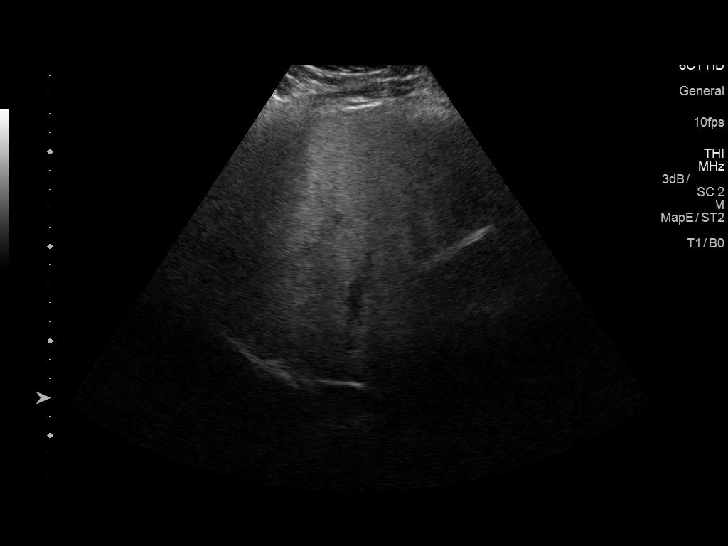
[im 21/39]
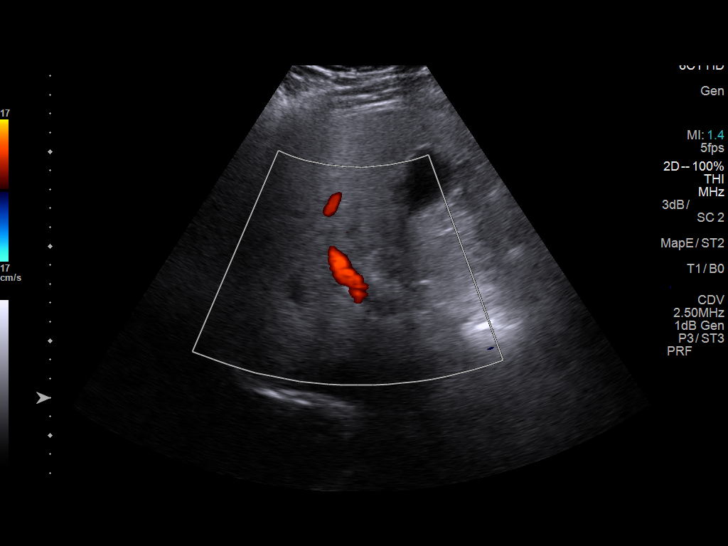
[im 24/39]
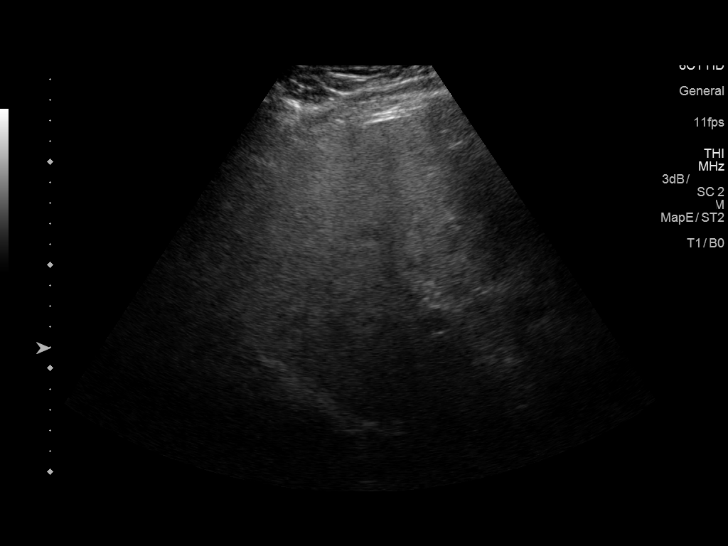
[im 26/39]
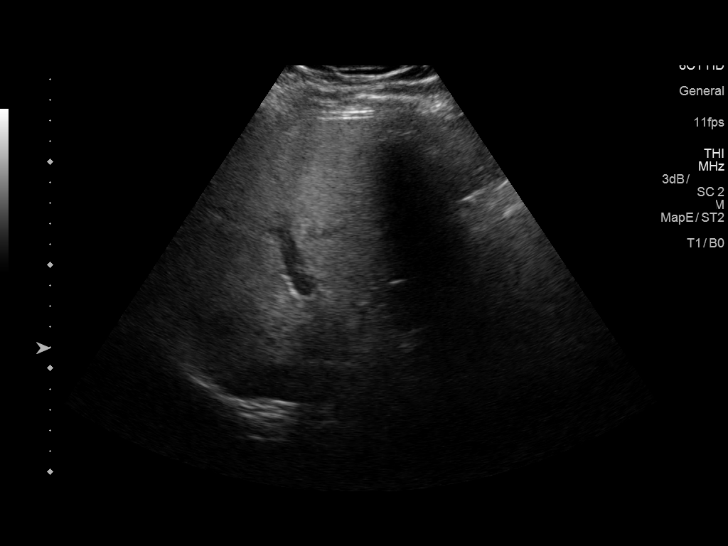
[im 29/39]
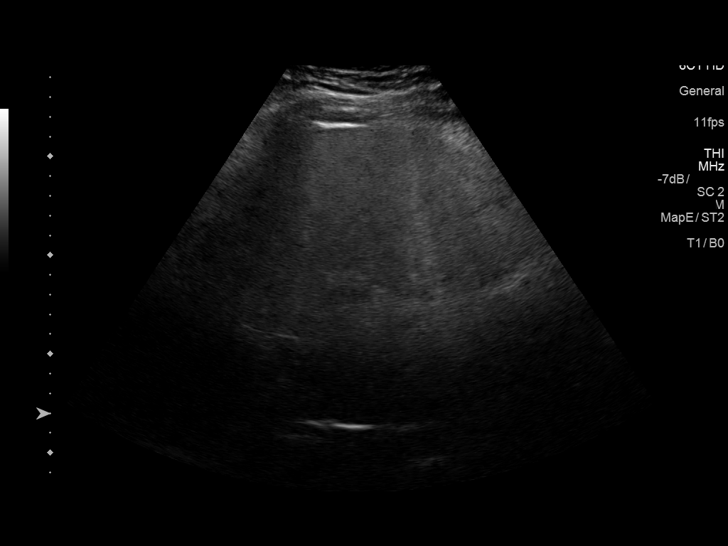
[im 32/39]
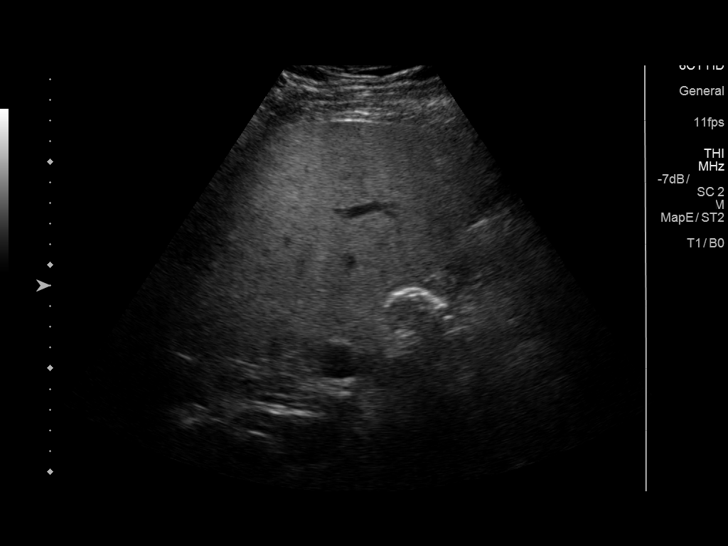
[im 35/39]
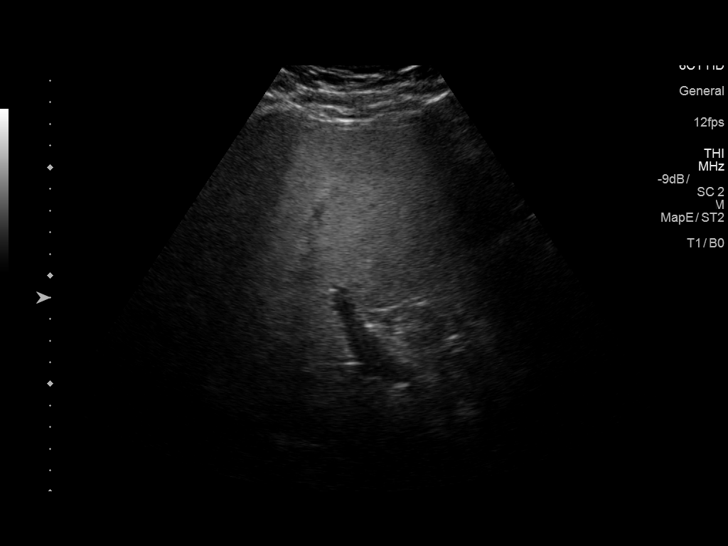
[im 39/39]
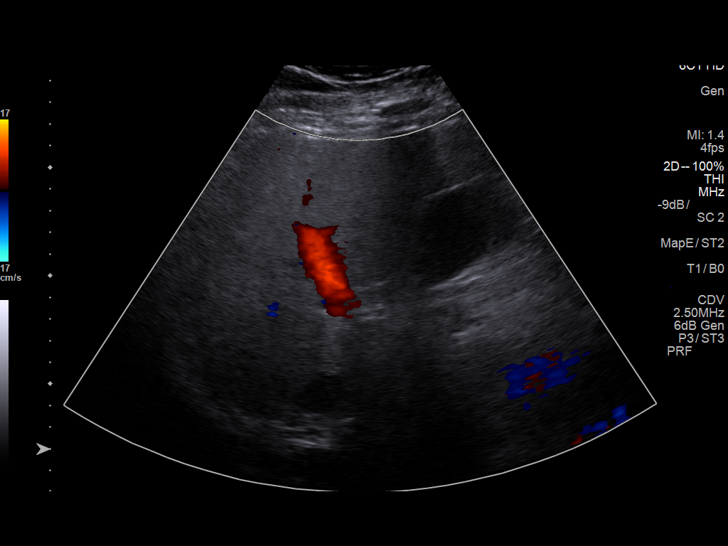

[14 of 25 positions shown; findings below may reference images not displayed]

FINDINGS: Gallbladder:

Normally distended without stones or wall thickening. No
pericholecystic fluid or sonographic Murphy sign.

Common bile duct:

Diameter: 3 mm

Liver:

Echogenic parenchyma, likely fatty infiltration though this can be
seen with cirrhosis and certain infiltrative disorders. No focal
hepatic mass or nodularity. Portal vein is patent on color Doppler
imaging with normal direction of blood flow towards the liver.

No RIGHT upper quadrant free fluid.
IMPRESSION: Probable fatty infiltration of liver as above.

Otherwise negative exam.

## 2018-05-04 DIAGNOSIS — G4726 Circadian rhythm sleep disorder, shift work type: Secondary | ICD-10-CM | POA: Insufficient documentation

## 2018-05-04 NOTE — Addendum Note (Signed)
Addended by: Melvyn Novas on: 05/04/2018 11:06 AM   Modules accepted: Orders

## 2018-05-04 NOTE — Procedures (Signed)
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 Evlyn Kanner for a sleep apnea evaluation.  Chief complaint according to patient: DOT physical. DM, HTN, Obesity, retrognathia.   He snores, as observed by his wife. He wakes up congested and with a parched mouth, has a lot of mucous/ congestion.  Epworth Sleepiness score 3/ 24 points, Fatigue severity score 16/ 63. BMI: 36.6  STUDY RESULTS:  Total Recording Time: 7 hours 55 minutes, Valid 7 h and 44 min. Total Apnea/Hypopnea Index (AHI): 36.7 /h; RDI:  38.3 /h Average Oxygen Saturation:  94 %; Lowest Oxygen Saturation: 73 %  Total Time in Oxygen Saturation below 89 %:  38.0 minutes  Average Heart Rate:  63 bpm (between 54 and 81 bpm) IMPRESSION: Severe Complex sleep apnea by apnea Link- 64% obstructive and 43% central apneas. Loud snoring. Oxygen desaturation at 36.8/h followed AHI.  Hypoxemia duration of almost 10% of recorded time.  RECOMMENDATION: The Severe Degree of mostly Obstructive Apnea and sleep hypoxemia require CPAP therapy. I will order an auto titration CPAP 6-16 cm water with 3 cm EPR and heated humidity, a mask of patients comfort and choice.   I certify that I have reviewed the raw data recording prior to the issuance of this report in accordance with the standards of the American Academy of Sleep Medicine (AASM). Marcus Novas, M.D.    05-04-2018      Medical Director of [facility], accredited by the AASM. Diplomat of the ABPN and ABSM.

## 2018-05-06 ENCOUNTER — Telehealth: Payer: Self-pay | Admitting: Neurology

## 2018-05-06 NOTE — Telephone Encounter (Signed)
Called patient to discuss sleep study results. No answer at this time. LVM for the patient to call back.   

## 2018-05-06 NOTE — Telephone Encounter (Signed)
-----   Message from Melvyn Novas, MD sent at 05/04/2018 11:06 AM EDT ----- IMPRESSION: Severe Complex sleep apnea by apnea Link- 64%  obstructive and 43% central apneas. Loud snoring. Oxygen  desaturation at 36.8/h followed AHI.  Hypoxemia duration of almost 10% of recorded time.   RECOMMENDATION: The Severe Degree of mostly Obstructive Apnea and  sleep hypoxemia require CPAP therapy. I will order an auto  titration CPAP 6-16 cm water with 3 cm EPR and heated humidity, a  mask of patients comfort and choice.

## 2018-05-13 ENCOUNTER — Encounter: Payer: Self-pay | Admitting: Neurology

## 2018-05-13 NOTE — Telephone Encounter (Signed)
2nd attempt to contact the patient to review the sleep study results. No answer. LVM informing the patient to call back. Will also send a message on my chart.

## 2018-05-13 NOTE — Telephone Encounter (Signed)
Pt returned call and I was able to go over the sleep study results. I advised pt that Dr. Vickey Huger reviewed their sleep study results and found that pt has sleep apnea. Dr. Vickey Huger recommends that pt starts an auto CPAP. I reviewed PAP compliance expectations with the pt. Pt is agreeable to starting a CPAP. I advised pt that an order will be sent to a DME, [facility], and [facility] will call the pt within about one week after they file with the pt's insurance. [facility] will show the pt how to use the machine, fit for masks, and troubleshoot the CPAP if needed. A follow up appt was made for insurance purposes with Butch Penny, NP on Jan 24,2019 at 9:00 am. Pt verbalized understanding to arrive 15 minutes early and bring their CPAP. A letter with all of this information in it will be mailed to the pt as a reminder. I verified with the pt that the address we have on file is correct. Pt verbalized understanding of results. Pt had no questions at this time but was encouraged to call back if questions arise.

## 2018-05-14 ENCOUNTER — Encounter: Payer: Self-pay | Admitting: Neurology

## 2018-06-01 NOTE — Progress Notes (Signed)
HPI The patient has a history of coronary calcium.  We found this on screening because he has a very strong family history of early onset coronary artery disease. He had a negative POET (Plain Old Exercise Treadmill) last in Feb of this year.   He is had some problems with increased weight gain because he is been traveling quite a bit.  He has not been able to eat quite as well or exercise as much.  He is also been found to have sleep apnea and is about to be treated with a mask.  He denies any other cardiovascular symptoms. The patient denies any new symptoms such as chest discomfort, neck or arm discomfort. There has been no new shortness of breath, PND or orthopnea. There have been no reported palpitations, presyncope or syncope.  He has noted that his blood pressure is typically in the 130s over 140s with diastolics in the 90s at home   No Known Allergies  Current Outpatient Medications  Medication Sig Dispense Refill  . aspirin 81 MG tablet Take 81 mg by mouth daily.    Marland Kitchen atenolol (TENORMIN) 50 MG tablet TAKE 1 TABLET BY MOUTH EVERY MORNING AND 2 TABLETS EVERY EVENING 270 tablet 0  . meloxicam (MOBIC) 15 MG tablet Take 1 tablet by mouth as needed.  5  . metFORMIN (GLUCOPHAGE) 1000 MG tablet TAKE 1/2 TABLET BY MOUTH IN THE MORNING AND AT NIGHT WITH FOOD FOR 1 WEEK, THEN INCREASE TO 1 TAB TWICE DAILY THEREAFTER   Office visit needed 60 tablet 0  . Multiple Vitamins-Minerals (MULTIVITAMIN WITH MINERALS) tablet Take 1 tablet by mouth daily.    . rosuvastatin (CRESTOR) 20 MG tablet Take 1 tablet (20 mg total) by mouth daily. 90 tablet 3   No current facility-administered medications for this visit.     Past Medical History:  Diagnosis Date  . Diabetes (HCC)   . Dyslipidemia    Low HDL  . High coronary artery calcium score   . Overweight   . Sleep apnea     Past Surgical History:  Procedure Laterality Date  . FINGER SURGERY    . VASECTOMY      ROS:  ED.  Otherwise as stated  in the HPI and negative for all other systems.  PHYSICAL EXAM BP 132/80 (BP Location: Right Arm, Patient Position: Sitting, Cuff Size: Normal)   Pulse 67   Ht 5\' 9"  (1.753 m)   Wt 247 lb (112 kg)   BMI 36.48 kg/m   GENERAL:  Well appearing NECK:  No jugular venous distention, waveform within normal limits, carotid upstroke brisk and symmetric, no bruits, no thyromegaly LUNGS:  Clear to auscultation bilaterally CHEST:  Unremarkable HEART:  PMI not displaced or sustained,S1 and S2 within normal limits, no S3, no S4, no clicks, no rubs, no murmurs ABD:  Flat, positive bowel sounds normal in frequency in pitch, no bruits, no rebound, no guarding, no midline pulsatile mass, no hepatomegaly, no splenomegaly EXT:  2 plus pulses throughout, no edema, no cyanosis no clubbing   EKG:  Sinus rhythm, rate 67, axis within normal limits, intervals within normal limits, poor anterior R wave progression.  06/02/2018   ASSESSMENT AND PLAN  CORONARY CALCIUM:     The patient had a negative POET (Plain Old Exercise Treadmill).   He will continue with risk reduction.  No change in therapy is otherwise indicated.  No further testing at this time.  OVERWEIGHT:      He  is going to get back on exercise and diet to lose some of the weight that he is getting back.  DM:   A1c was 6.4.   This is managed by Adrian Prince, MD   DYSLIPIDEMIA:      His LDL was 97 with an HDL of 39.  I am going to take the liberty of changing him to Crestor 20 mg and profile in 8 weeks.  The goal will be an LDL less than 70.  SLEEP APNEA: He is about to get treated for this and this should help with his blood pressure.  HTN: Blood pressure is not at target.  He is going to work on diet weight loss and exercise first.  He will then keep a blood pressure diary for a couple of weeks and it is not at target he will probably need another agent added.  He can bring his blood pressure cuff back here to have it checked to make sure is  accurate.

## 2018-06-02 ENCOUNTER — Ambulatory Visit: Payer: No Typology Code available for payment source | Admitting: Cardiology

## 2018-06-02 ENCOUNTER — Encounter: Payer: Self-pay | Admitting: Cardiology

## 2018-06-02 VITALS — BP 132/80 | HR 67 | Ht 69.0 in | Wt 247.0 lb

## 2018-06-02 DIAGNOSIS — I10 Essential (primary) hypertension: Secondary | ICD-10-CM | POA: Diagnosis not present

## 2018-06-02 DIAGNOSIS — Z23 Encounter for immunization: Secondary | ICD-10-CM

## 2018-06-02 DIAGNOSIS — E785 Hyperlipidemia, unspecified: Secondary | ICD-10-CM

## 2018-06-02 DIAGNOSIS — R931 Abnormal findings on diagnostic imaging of heart and coronary circulation: Secondary | ICD-10-CM

## 2018-06-02 MED ORDER — ROSUVASTATIN CALCIUM 20 MG PO TABS: 20.0000 mg | ORAL_TABLET | Freq: Every day | ORAL | 3 refills | Status: DC

## 2018-06-02 MED FILL — ROSUVASTATIN CALCIUM 20 MG: 20 | 90 days supply | Qty: 90 | Fill #0

## 2018-06-02 NOTE — Patient Instructions (Signed)
Medication Instructions:  STOP- Pravastatin START- Crestor 20 mg daily  If you need a refill on your cardiac medications before your next appointment, please call your pharmacy.  Labwork: Fasting Lipids and Liver in 8 weeks HERE IN OUR OFFICE AT [facility]  Take the provided lab slips with you to the lab for your blood draw.   You will need to fast. DO NOT EAT OR DRINK PAST MIDNIGHT.   If you have labs (blood work) drawn today and your tests are completely normal, you will receive your results only by: Marland Kitchen MyChart Message (if you have MyChart) OR . A paper copy in the mail If you have any lab test that is abnormal or we need to change your treatment, we will call you to review the results.  Testing/Procedures: None Ordered  Follow-Up: . You will need a follow up appointment in 3 Months with Dr Antoine Poche.   At [facility], you and your health needs are our priority.  As part of our continuing mission to provide you with exceptional heart care, we have created designated Provider Care Teams.  These Care Teams include your primary Cardiologist (physician) and Advanced Practice Providers (APPs -  Physician Assistants and Nurse Practitioners) who all work together to provide you with the care you need, when you need it.   Thank you for choosing [facility] at [facility]!!

## 2018-06-09 ENCOUNTER — Encounter: Payer: Self-pay | Admitting: Neurology

## 2018-06-09 ENCOUNTER — Telehealth: Payer: Self-pay | Admitting: Neurology

## 2018-06-09 DIAGNOSIS — G4726 Circadian rhythm sleep disorder, shift work type: Secondary | ICD-10-CM

## 2018-06-09 DIAGNOSIS — G4733 Obstructive sleep apnea (adult) (pediatric): Secondary | ICD-10-CM

## 2018-06-09 DIAGNOSIS — R0683 Snoring: Secondary | ICD-10-CM

## 2018-06-09 DIAGNOSIS — E119 Type 2 diabetes mellitus without complications: Secondary | ICD-10-CM

## 2018-06-09 DIAGNOSIS — I10 Essential (primary) hypertension: Secondary | ICD-10-CM

## 2018-06-09 NOTE — Telephone Encounter (Signed)
Patient showed up to the sleep lab today informing us that he was set up in the CPAP class at [facility] today and received his CPAP. Patient was under the impression he could get the travel CPAP. Advised the patient that typically insurance will not cover the travel cpap. Patient was told that his insurance told them they likey would cover it. I sent a message and verified with [facility] and they also informed that travel CPAP is not covered. Also the patient's  has [employer] broker) insurance. His plan does not carry a deductible so his copay is smaller than most.  Insurance tells [place] what we can charge so the pricing would be the same with any DME company. Only difference might would be the type of machine (brand etc). Travel units are battery powered and insurance doesn't warrant those being a "medical necessity".  Patient was given this information.

## 2018-06-09 NOTE — Telephone Encounter (Signed)
Patient states that he needs a travel CPAP machine. He states that [facility] said that he needs a prescription from his MD to receive the travel CPAP machine. Patient is requesting for Dr. Vickey Huger or RN to help him set up the process to receive travel CPAP machine. Patient states he travels for his job. He is gone Monday - Friday and it is important that he has a travel CPAP machine.

## 2018-06-09 NOTE — Telephone Encounter (Signed)
Already previously documented on this patient. All I can do is send the order over to the company [facility]. My understanding is that insurance will not cover the travel cpap based off what I was just told from the [facility] personnel. I will send the order and see what happens but as mentioned in the other phone note. The travel cpap are deemed not medical necessary from most insurance companies.

## 2018-06-09 NOTE — Addendum Note (Signed)
Addended by: Judi Cong on: 06/09/2018 05:06 PM   Modules accepted: Orders

## 2018-06-09 NOTE — Telephone Encounter (Signed)
To whom it may concern,  [name], date of birth 07-Jul-1971, has been a patient in our sleep clinic.   Since he travels for his employer he would benefit greatly from having a travel CPAP machine.   Please contact [place] if you need any additional information.  We would support this patient in obtaining a travel CPAP given his frequent travels as it will assist with his compliance.   Sincerely, Melvyn Novas MD   06-09-2018

## 2018-06-19 MED FILL — FREESTYLE LANCETS: 25 days supply | Qty: 100 | Fill #1

## 2018-06-19 MED FILL — FREESTYLE LITE TEST STRIP: 25 days supply | Qty: 100 | Fill #1

## 2018-07-04 MED FILL — JARDIANCE 25 MG TABLET: 25 | 90 days supply | Qty: 90 | Fill #0

## 2018-07-18 ENCOUNTER — Other Ambulatory Visit: Payer: Self-pay | Admitting: Cardiology

## 2018-07-18 ENCOUNTER — Other Ambulatory Visit: Payer: Self-pay

## 2018-07-18 MED ORDER — ATENOLOL 50 MG PO TABS: ORAL_TABLET | ORAL | 3 refills | Status: DC

## 2018-07-18 MED FILL — ATENOLOL 50 MG TABLET: 50 | 90 days supply | Qty: 270 | Fill #0

## 2018-07-18 NOTE — Telephone Encounter (Signed)
Medication refilled

## 2018-07-18 NOTE — Telephone Encounter (Signed)
Rx request sent to pharmacy.  

## 2018-07-24 ENCOUNTER — Telehealth: Payer: Self-pay | Admitting: Neurology

## 2018-07-24 NOTE — Telephone Encounter (Signed)
Spoke with the patient and was able to bring him to our sleep lab area and Zella BallRobin was able to discuss setting him up with a mask

## 2018-07-24 NOTE — Telephone Encounter (Signed)
Patient walked in requesting Rx for CPAP mask as his dog chewed it this morning. He states he is leaving town and needs one ASAP. He will wait in the lobby for a little while. He got the original through [facility] but they are closed today. Best call back is [phone]

## 2018-08-22 ENCOUNTER — Ambulatory Visit: Payer: Self-pay | Admitting: Adult Health

## 2018-09-05 MED FILL — ROSUVASTATIN CALCIUM 20 MG: 20 | 90 days supply | Qty: 90 | Fill #1 | Status: TO

## 2018-09-07 NOTE — Progress Notes (Signed)
HPI The patient has a history of coronary calcium.  We found this on screening because he has a very strong family history of early onset coronary artery disease. He had a negative POET (Plain Old Exercise Treadmill) last in Feb of last year.  At the last visit he was having increased fatigue.  He has since been treated with CPAP for sleep apnea.  He is also changed his traveling schedule as he was doing a great deal of traveling and was under stress.  He feels better with this.  He starting to do some exercise. The patient denies any new symptoms such as chest discomfort, neck or arm discomfort. There has been no new shortness of breath, PND or orthopnea. There have been no reported palpitations, presyncope or syncope  No Known Allergies  Current Outpatient Medications  Medication Sig Dispense Refill  . aspirin 81 MG tablet Take 81 mg by mouth daily.    Marland Kitchen atenolol (TENORMIN) 50 MG tablet Take 1 tablet by mouth every morning and 2 tablets every evening 270 tablet 3  . Empagliflozin-metFORMIN HCl ER (SYNJARDY XR) 25-1000 MG TB24 Take 1 tablet by mouth daily.    . Multiple Vitamins-Minerals (MULTIVITAMIN WITH MINERALS) tablet Take 1 tablet by mouth daily.    . rosuvastatin (CRESTOR) 20 MG tablet Take 1 tablet (20 mg total) by mouth daily. 90 tablet 3   No current facility-administered medications for this visit.     Past Medical History:  Diagnosis Date  . Diabetes (HCC)   . Dyslipidemia    Low HDL  . High coronary artery calcium score   . Overweight   . Sleep apnea     Past Surgical History:  Procedure Laterality Date  . FINGER SURGERY    . VASECTOMY      ROS:  Positive for  ED.  Otherwise as stated in the HPI and negative for all other systems.  PHYSICAL EXAM BP 132/90   Pulse 62   Ht 5\' 9"  (1.753 m)   Wt 246 lb 6.4 oz (111.8 kg)   SpO2 97%   BMI 36.39 kg/m   GENERAL:  Well appearing NECK:  No jugular venous distention, waveform within normal limits, carotid  upstroke brisk and symmetric, no bruits, no thyromegaly LUNGS:  Clear to auscultation bilaterally CHEST:  Unremarkable HEART:  PMI not displaced or sustained,S1 and S2 within normal limits, no S3, no S4, no clicks, no rubs, no murmurs ABD:  Flat, positive bowel sounds normal in frequency in pitch, no bruits, no rebound, no guarding, no midline pulsatile mass, no hepatomegaly, no splenomegaly EXT:  2 plus pulses throughout, no edema, no cyanosis no clubbing   EKG:  NA  ASSESSMENT AND PLAN  CORONARY CALCIUM:     He had a negative POET (Plain Old Exercise Treadmill) in 2018.   We will going to continue with aggressive risk reduction.  I will plan a treadmill screening test next year.   OVERWEIGHT:     He has lost some weight.  We talked about specifics.  I encouraged more of the same.   DM:   A1c was slightly elevated at 7.9.  However, this is actively being managed by Dr. Evlyn Kanner.  He has had his meds changed recently to include Jardiance.  DYSLIPIDEMIA:      His LDL was 97.  I did change his Crestor and I will repeat his lipid profile today.  SLEEP APNEA: He is being treated by Dr. Vickey Huger.  He hates  his mask but he uses it.  He is going to go back and talk to her more about this.  HTN: Blood pressure is borderline.  No change in therapy.  I would like to continue with aggressive risk reduction

## 2018-09-08 ENCOUNTER — Encounter: Payer: Self-pay | Admitting: Cardiology

## 2018-09-08 ENCOUNTER — Ambulatory Visit: Payer: No Typology Code available for payment source | Admitting: Cardiology

## 2018-09-08 VITALS — BP 132/90 | HR 62 | Ht 69.0 in | Wt 246.4 lb

## 2018-09-08 DIAGNOSIS — R931 Abnormal findings on diagnostic imaging of heart and coronary circulation: Secondary | ICD-10-CM | POA: Diagnosis not present

## 2018-09-08 DIAGNOSIS — E785 Hyperlipidemia, unspecified: Secondary | ICD-10-CM | POA: Diagnosis not present

## 2018-09-08 DIAGNOSIS — I10 Essential (primary) hypertension: Secondary | ICD-10-CM | POA: Diagnosis not present

## 2018-09-08 DIAGNOSIS — E1169 Type 2 diabetes mellitus with other specified complication: Secondary | ICD-10-CM

## 2018-09-08 DIAGNOSIS — E119 Type 2 diabetes mellitus without complications: Secondary | ICD-10-CM | POA: Insufficient documentation

## 2018-09-08 LAB — LIPID PANEL
Chol/HDL Ratio: 3.5 ratio (ref 0.0–5.0)
Cholesterol, Total: 127 mg/dL (ref 100–199)
HDL: 36 mg/dL — AB (ref >39)
LDL Calculated: 53 mg/dL (ref 0–99)
Triglycerides: 189 mg/dL — ABNORMAL HIGH (ref 0–149)
VLDL Cholesterol Cal: 38 mg/dL (ref 5–40)

## 2018-09-08 LAB — HEPATIC FUNCTION PANEL
ALT: 107 IU/L — ABNORMAL HIGH (ref 0–44)
AST: 85 IU/L — ABNORMAL HIGH (ref 0–40)
Albumin: 4.7 g/dL (ref 4.0–5.0)
Alkaline Phosphatase: 55 IU/L (ref 39–117)
BILIRUBIN, DIRECT: 0.09 mg/dL (ref 0.00–0.40)
Bilirubin Total: 0.3 mg/dL (ref 0.0–1.2)
Total Protein: 7.4 g/dL (ref 6.0–8.5)

## 2018-09-08 NOTE — Patient Instructions (Signed)
Medication Instructions:  Continue current medications  If you need a refill on your cardiac medications before your next appointment, please call your pharmacy.  Labwork: Fasting Lipid and Liver HERE IN OUR OFFICE AT [facility]  You will need to fast. DO NOT EAT OR DRINK PAST MIDNIGHT.     Take the provided lab slips with you to the lab for your blood draw.   When you have your labs (blood work) drawn today and your tests are completely normal, you will receive your results only by MyChart Message (if you have MyChart) -OR-  A paper copy in the mail.  If you have any lab test that is abnormal or we need to change your treatment, we will call you to review these results.  Testing/Procedures: Your physician has requested that you have an exercise tolerance test in 1 Year. For further information please visit [email or web address] Please also follow instruction sheet, as given.  Follow-Up: You will need a follow up appointment in 1 Year.  Please call our office 2 months in advance to schedule this appointment.  You may see Dr Antoine Poche or one of the following Advanced Practice Providers on your designated Care Team:   Theodore Demark, PA-C . Joni Reining, DNP, ANP   At [facility], you and your health needs are our priority.  As part of our continuing mission to provide you with exceptional heart care, we have created designated Provider Care Teams.  These Care Teams include your primary Cardiologist (physician) and Advanced Practice Providers (APPs -  Physician Assistants and Nurse Practitioners) who all work together to provide you with the care you need, when you need it.  Thank you for choosing [facility] at [facility]!!

## 2018-09-09 ENCOUNTER — Telehealth: Payer: Self-pay | Admitting: *Deleted

## 2018-09-09 DIAGNOSIS — Z79899 Other long term (current) drug therapy: Secondary | ICD-10-CM

## 2018-09-09 NOTE — Telephone Encounter (Signed)
-----   Message from Rollene RotundaJames Hochrein, MD sent at 09/09/2018  9:11 AM EST ----- LDL is improved.  His liver enzymes are mildly elevated but better than previous.  I would like to repeat the liver enzymes in 6 weeks.  Call [name] with the results and send results to Adrian PrinceSouth, Stephen, MD

## 2018-09-09 NOTE — Telephone Encounter (Signed)
Liver function labs ordered and mailed to pt.Marcus Dickson

## 2018-09-10 ENCOUNTER — Encounter: Payer: Self-pay | Admitting: Neurology

## 2018-09-11 ENCOUNTER — Encounter: Payer: Self-pay | Admitting: Family Medicine

## 2018-09-11 ENCOUNTER — Ambulatory Visit (INDEPENDENT_AMBULATORY_CARE_PROVIDER_SITE_OTHER): Payer: No Typology Code available for payment source | Admitting: Family Medicine

## 2018-09-11 VITALS — BP 122/80 | HR 63 | Ht 69.0 in | Wt 248.0 lb

## 2018-09-11 DIAGNOSIS — Z9989 Dependence on other enabling machines and devices: Secondary | ICD-10-CM | POA: Diagnosis not present

## 2018-09-11 DIAGNOSIS — G4733 Obstructive sleep apnea (adult) (pediatric): Secondary | ICD-10-CM

## 2018-09-11 NOTE — Patient Instructions (Signed)
Continue using CPAP nightly and for greater than 4 hours each night.  Follow up in 1 year   Sleep Apnea Sleep apnea affects breathing during sleep. It causes breathing to stop for a short time or to become shallow. It can also increase the risk of:  Heart attack.  Stroke.  Being very overweight (obese).  Diabetes.  Heart failure.  Irregular heartbeat. The goal of treatment is to help you breathe normally again. What are the causes? There are three kinds of sleep apnea:  Obstructive sleep apnea. This is caused by a blocked or collapsed airway.  Central sleep apnea. This happens when the brain does not send the right signals to the muscles that control breathing.  Mixed sleep apnea. This is a combination of obstructive and central sleep apnea. The most common cause of this condition is a collapsed or blocked airway. This can happen if:  Your throat muscles are too relaxed.  Your tongue and tonsils are too large.  You are overweight.  Your airway is too small. What increases the risk?  Being overweight.  Smoking.  Having a small airway.  Being older.  Being male.  Drinking alcohol.  Taking medicines to calm yourself (sedatives or tranquilizers).  Having family members with the condition. What are the signs or symptoms?  Trouble staying asleep.  Being sleepy or tired during the day.  Getting angry a lot.  Loud snoring.  Headaches in the morning.  Not being able to focus your mind (concentrate).  Forgetting things.  Less interest in sex.  Mood swings.  Personality changes.  Feelings of sadness (depression).  Waking up a lot during the night to pee (urinate).  Dry mouth.  Sore throat. How is this diagnosed?  Your medical history.  A physical exam.  A test that is done when you are sleeping (sleep study). The test is most often done in a sleep lab but may also be done at home. How is this treated?   Sleeping on your side.  Using a  medicine to get rid of mucus in your nose (decongestant).  Avoiding the use of alcohol, medicines to help you relax, or certain pain medicines (narcotics).  Losing weight, if needed.  Changing your diet.  Not smoking.  Using a machine to open your airway while you sleep, such as: ? An oral appliance. This is a mouthpiece that shifts your lower jaw forward. ? A CPAP device. This device blows air through a mask when you breathe out (exhale). ? An EPAP device. This has valves that you put in each nostril. ? A BPAP device. This device blows air through a mask when you breathe in (inhale) and breathe out.  Having surgery if other treatments do not work. It is important to get treatment for sleep apnea. Without treatment, it can lead to:  High blood pressure.  Coronary artery disease.  In men, not being able to have an erection (impotence).  Reduced thinking ability. Follow these instructions at home: Lifestyle  Make changes that your doctor recommends.  Eat a healthy diet.  Lose weight if needed.  Avoid alcohol, medicines to help you relax, and some pain medicines.  Do not use any products that contain nicotine or tobacco, such as cigarettes, e-cigarettes, and chewing tobacco. If you need help quitting, ask your doctor. General instructions  Take over-the-counter and prescription medicines only as told by your doctor.  If you were given a machine to use while you sleep, use it only as told  by your doctor.  If you are having surgery, make sure to tell your doctor you have sleep apnea. You may need to bring your device with you.  Keep all follow-up visits as told by your doctor. This is important. Contact a doctor if:  The machine that you were given to use during sleep bothers you or does not seem to be working.  You do not get better.  You get worse. Get help right away if:  Your chest hurts.  You have trouble breathing in enough air.  You have an uncomfortable  feeling in your back, arms, or stomach.  You have trouble talking.  One side of your body feels weak.  A part of your face is hanging down. These symptoms may be an emergency. Do not wait to see if the symptoms will go away. Get medical help right away. Call your local emergency services (911 in the [place] Do not drive yourself to the hospital. Summary  This condition affects breathing during sleep.  The most common cause is a collapsed or blocked airway.  The goal of treatment is to help you breathe normally while you sleep. This information is not intended to replace advice given to you by your health care provider. Make sure you discuss any questions you have with your health care provider. Document Released: 04/24/2008 Document Revised: 03/11/2018 Document Reviewed: 03/11/2018 Elsevier Interactive Patient Education  [employer].

## 2018-09-11 NOTE — Progress Notes (Signed)
PATIENT: Laqueta JeanLance E Nordquist DOB: 11/26/1970  REASON FOR VISIT: follow up HISTORY FROM: patient  Chief Complaint  Patient presents with  . Follow-up    Initial cpap follow up. Alone. New room. No concerns about his cpap machine.      HISTORY OF PRESENT ILLNESS: Today 09/11/18 Laqueta JeanLance E Miltner is a 48 y.o. male here today for follow up.  He is feeling well without complaints today.  His download compliance report shows he is using his CPAP 29 out of 30 days for compliance denies any percent.  He is using his machine 24 out of 30 days for compliance of 80%.  He is using his machine on average 5 hours and 11 minutes.  His AHI was 1.4 on 6 to 16 cm of water with a EPR of 3.  There is no significant leak.  He has not noticed much of a difference in his sleep.  He continues to work different shifts however he has had a conversation with his employer to limit this.  He has been using his machine since 30 May 2018.   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 Evlyn KannerSouth for a sleep apnea evaluation.   Chief complaint according to patient : DOT physical. DM, HTN, Obesity.   Sleep habits are as follows: irregular work hours determine irregular sleep times.  He may work between 4 AM and until 10 PM, and he travels a lot with an Administrator, sportsinstallment crew.  [name] is not primarily a driver but his company requires a driver for each work crew.  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   Whenever he can retreat to the bedroom he is asleep in 15 seconds and his wife reports he snores soon after - but not continuously, his wife tries to get him off his back. He can't sleep very long on his sides as his shoulders hurt. The bedroom is dark, cool at 68 F, quiet. He always wakes up at 7.30 AM while on the road or at home. - no matter when he  went to bed. Even when not on the road he gets phone calls, but works mostly day shift.  He doesn't wake choking -   Average sleep duration at home 7 hours, on the road - anything possible. He wakes up congested and with a parched mouth, has a lot mucous.    Sleep medical history and family sleep history:  Snoring for several years , as he gained weight - he has lost 30 pounds since 2018 and is able to keep it off.  Retrognathia.  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 Non smoker, ETOH- seldomly .  Caffeine : 2 cups a day of coffee, shift worker - swing shifts.   REVIEW OF SYSTEMS: Out of a complete 14 system review of symptoms, the patient complains only of the following symptoms, none and all other reviewed systems are negative.  Epworth sleepiness scale 5 Fatigue severity scale 9  ALLERGIES: No Known Allergies  HOME MEDICATIONS: Outpatient Medications Prior to Visit  Medication Sig Dispense Refill  . aspirin 81 MG tablet Take 81 mg by mouth daily.    .Marland Kitchen  atenolol (TENORMIN) 50 MG tablet Take 1 tablet by mouth every morning and 2 tablets every evening 270 tablet 3  . Empagliflozin-metFORMIN HCl ER (SYNJARDY XR) 25-1000 MG TB24 Take 1 tablet by mouth daily.    . Multiple Vitamins-Minerals (MULTIVITAMIN WITH MINERALS) tablet Take 1 tablet by mouth daily.    . rosuvastatin (CRESTOR) 20 MG tablet Take 1 tablet (20 mg total) by mouth daily. 90 tablet 3  . Empagliflozin-metFORMIN HCl ER (SYNJARDY XR) 25-1000 MG TB24 Take 1 tablet by mouth daily.     No facility-administered medications prior to visit.     PAST MEDICAL HISTORY: Past Medical History:  Diagnosis Date  . Diabetes (HCC)   . Dyslipidemia    Low HDL  . High coronary artery calcium score   . Overweight   . Sleep apnea     PAST SURGICAL HISTORY: Past Surgical History:  Procedure Laterality Date  .  FINGER SURGERY    . VASECTOMY      FAMILY HISTORY: Family History  Problem Relation Age of Onset  . CAD Father 19       Died MI - was a smoker  . Heart attack Father   . Stroke Father   . CAD Mother 50       CABG; post op cardiac arrest during knee surgery   . Heart attack Mother   . Stroke Paternal Grandmother     SOCIAL HISTORY: Social History   Socioeconomic History  . Marital status: Married    Spouse name: Not on file  . Number of children: 2  . Years of education: Not on file  . Highest education level: Not on file  Occupational History  . Occupation: Teaching laboratory technician: [employer]  Social Needs  . Financial resource strain: Not on file  . Food insecurity:    Worry: Not on file    Inability: Not on file  . Transportation needs:    Medical: Not on file    Non-medical: Not on file  Tobacco Use  . Smoking status: Never Smoker  . Smokeless tobacco: Never Used  Substance and Sexual Activity  . Alcohol use: No  . Drug use: No  . Sexual activity: Yes  Lifestyle  . Physical activity:    Days per week: Not on file    Minutes per session: Not on file  . Stress: Not on file  Relationships  . Social connections:    Talks on phone: Not on file    Gets together: Not on file    Attends religious service: Not on file    Active member of club or organization: Not on file    Attends meetings of clubs or organizations: Not on file    Relationship status: Not on file  . Intimate partner violence:    Fear of current or ex partner: Not on file    Emotionally abused: Not on file    Physically abused: Not on file    Forced sexual activity: Not on file  Other Topics Concern  . Not on file  Social History Narrative  . Not on file      PHYSICAL EXAM  Vitals:   09/11/18 1320  BP: 122/80  Pulse: 63  Weight: 248 lb (112.5 kg)  Height: 5\' 9"  (1.753 m)   Body mass index is 36.62 kg/m.  Generalized: Well developed, in no acute distress    Cardiology: normal rate and rhythm, no murmur noted Respiratory: Clear to auscultation bilaterally  Mallampati 3+, neck circumference 19.5 inches Neurological examination  Mentation: Alert oriented to time, place, history taking. Follows all commands speech and language fluent Cranial nerve II-XII: Pupils were equal round reactive to light. Extraocular movements were full, visual field were full on confrontational test. Facial sensation and strength were normal. Uvula tongue midline. Head turning and shoulder shrug  were normal and symmetric. Motor: The motor testing reveals 5 over 5 strength of all 4 extremities. Good symmetric motor tone is noted throughout.  Sensory: Sensory testing is intact to soft touch on all 4 extremities. No evidence of extinction is noted.  Coordination: Cerebellar testing reveals good finger-nose-finger and heel-to-shin bilaterally.  Gait and station: Gait is normal.   DIAGNOSTIC DATA (LABS, IMAGING, TESTING) - I reviewed patient records, labs, notes, testing and imaging myself where available.  No flowsheet data found.   Lab Results  Component Value Date   WBC 6.9 07/13/2016   HGB 15.5 07/13/2016   HCT 42.6 (A) 07/13/2016   MCV 81.2 07/13/2016   PLT 271 03/13/2010      Component Value Date/Time   NA 142 07/26/2016 1617   K 4.4 07/26/2016 1617   CL 103 07/26/2016 1617   CO2 22 07/26/2016 1617   GLUCOSE 104 (H) 07/26/2016 1617   GLUCOSE 87 03/13/2010 1044   BUN 12 07/26/2016 1617   CREATININE 0.83 07/26/2016 1617   CALCIUM 9.6 07/26/2016 1617   PROT 7.4 09/08/2018 0944   ALBUMIN 4.7 09/08/2018 0944   AST 85 (H) 09/08/2018 0944   ALT 107 (H) 09/08/2018 0944   ALKPHOS 55 09/08/2018 0944   BILITOT 0.3 09/08/2018 0944   GFRNONAA 105 07/26/2016 1617   GFRAA 121 07/26/2016 1617   Lab Results  Component Value Date   CHOL 127 09/08/2018   HDL 36 (L) 09/08/2018   LDLCALC 53 09/08/2018   TRIG 189 (H) 09/08/2018   CHOLHDL 3.5 09/08/2018   Lab  Results  Component Value Date   HGBA1C 10.6 (H) 07/26/2016   No results found for: VITAMINB12 No results found for: TSH   ASSESSMENT AND PLAN 48 y.o. year old male  has a past medical history of Diabetes (HCC), Dyslipidemia, High coronary artery calcium score, Overweight, and Sleep apnea. here with     ICD-10-CM   1. OSA on CPAP G47.33    Z99.89     Mr. Quertermous is doing well overall.  He has not noted much benefit with using CPAP but is in agreement to continue using this nightly.  Epworth sleepiness scale is 5 today.  He was encouraged to continue nightly use of CPAP.  I have also discussed using CPAP for greater than 4 hours each night.  I have encouraged him to continue with his weight loss efforts.  He has lost about 30 pounds in a year.  Should he continue to lose weight we will discuss repeating sleep study.  He is in agreement with this plan.  He will follow-up with [place] in 1 year   No orders of the defined types were placed in this encounter.    No orders of the defined types were placed in this encounter.     I spent 15 minutes with the patient. 50% of this time was spent counseling and educating patient on plan of care and medications.    Shawnie Dapper, FNP-C 09/11/2018, 2:05 PM [facility] [address] [place], [place] [address] [phone]

## 2018-09-29 MED FILL — metFORMIN HCL 1000 MG TABS: 1000 | 90 days supply | Qty: 180 | Fill #1

## 2018-10-17 MED FILL — SYNJARDY XR 25-1000 MG TB24: 25-1000 | 30 days supply | Qty: 30 | Fill #0 | Status: TO

## 2018-10-20 MED FILL — ATENOLOL 50 MG TABLET: 50 | 90 days supply | Qty: 270 | Fill #0 | Status: TO

## 2018-10-31 MED FILL — ROSUVASTATIN CALCIUM 20 MG: 20 | 90 days supply | Qty: 90 | Fill #0 | Status: TO

## 2018-10-31 MED FILL — FREESTYLE LITE TEST STRIP: 25 days supply | Qty: 100 | Fill #0 | Status: TO

## 2018-10-31 MED FILL — SYNJARDY XR 25-1000 MG TB24: 25-1000 | 90 days supply | Qty: 90 | Fill #0 | Status: TO

## 2018-12-25 MED FILL — FREESTYLE LITE TEST STRIP: 25 days supply | Qty: 100 | Fill #0

## 2018-12-25 MED FILL — FREESTYLE LANCETS: 25 days supply | Qty: 100 | Fill #2

## 2019-02-09 MED FILL — ATENOLOL 50 MG TABLET: 50 | 90 days supply | Qty: 270 | Fill #0

## 2019-02-09 MED FILL — SYNJARDY XR 25-1000 MG TB24: 25-1000 | 30 days supply | Qty: 30 | Fill #0

## 2019-02-17 ENCOUNTER — Other Ambulatory Visit: Payer: Self-pay

## 2019-02-17 ENCOUNTER — Encounter (INDEPENDENT_AMBULATORY_CARE_PROVIDER_SITE_OTHER): Payer: No Typology Code available for payment source | Admitting: Ophthalmology

## 2019-02-17 DIAGNOSIS — H43813 Vitreous degeneration, bilateral: Secondary | ICD-10-CM

## 2019-02-17 DIAGNOSIS — H33301 Unspecified retinal break, right eye: Secondary | ICD-10-CM

## 2019-02-17 DIAGNOSIS — H35033 Hypertensive retinopathy, bilateral: Secondary | ICD-10-CM

## 2019-02-17 DIAGNOSIS — I10 Essential (primary) hypertension: Secondary | ICD-10-CM | POA: Diagnosis not present

## 2019-02-18 ENCOUNTER — Other Ambulatory Visit: Payer: Self-pay | Admitting: Endocrinology

## 2019-02-18 DIAGNOSIS — K760 Fatty (change of) liver, not elsewhere classified: Secondary | ICD-10-CM

## 2019-02-18 DIAGNOSIS — R7401 Elevation of levels of liver transaminase levels: Secondary | ICD-10-CM

## 2019-02-18 DIAGNOSIS — J849 Interstitial pulmonary disease, unspecified: Secondary | ICD-10-CM

## 2019-02-27 ENCOUNTER — Other Ambulatory Visit: Payer: Self-pay | Admitting: Endocrinology

## 2019-02-27 DIAGNOSIS — K760 Fatty (change of) liver, not elsewhere classified: Secondary | ICD-10-CM

## 2019-02-27 DIAGNOSIS — R7401 Elevation of levels of liver transaminase levels: Secondary | ICD-10-CM

## 2019-03-02 ENCOUNTER — Other Ambulatory Visit: Payer: Self-pay | Admitting: Endocrinology

## 2019-03-03 ENCOUNTER — Other Ambulatory Visit: Payer: No Typology Code available for payment source

## 2019-03-03 ENCOUNTER — Ambulatory Visit
Admission: RE | Admit: 2019-03-03 | Discharge: 2019-03-03 | Disposition: A | Discharge status: Home / Self Care | Payer: No Typology Code available for payment source | Source: Ambulatory Visit | Attending: Endocrinology | Admitting: Endocrinology

## 2019-03-03 ENCOUNTER — Other Ambulatory Visit: Payer: Self-pay

## 2019-03-03 DIAGNOSIS — K760 Fatty (change of) liver, not elsewhere classified: Secondary | ICD-10-CM

## 2019-03-03 DIAGNOSIS — R7401 Elevation of levels of liver transaminase levels: Secondary | ICD-10-CM

## 2019-03-03 MED ORDER — IOPAMIDOL (ISOVUE-300) INJECTION 61%
125.0000 mL | Freq: Once | INTRAVENOUS | Status: AC | PRN
  Administered 2019-03-03: 125 mL via INTRAVENOUS

## 2019-03-04 MED FILL — SYNJARDY XR 25-1000 MG TB24: 25-1000 | 30 days supply | Qty: 30 | Fill #0

## 2019-03-09 ENCOUNTER — Other Ambulatory Visit: Payer: No Typology Code available for payment source

## 2019-03-23 ENCOUNTER — Encounter: Payer: Self-pay | Admitting: Gastroenterology

## 2019-04-30 MED FILL — SYNJARDY XR 25-1000 MG TB24: 25-1000 | 30 days supply | Qty: 30 | Fill #1

## 2019-05-01 ENCOUNTER — Other Ambulatory Visit (INDEPENDENT_AMBULATORY_CARE_PROVIDER_SITE_OTHER): Payer: No Typology Code available for payment source

## 2019-05-01 ENCOUNTER — Ambulatory Visit (INDEPENDENT_AMBULATORY_CARE_PROVIDER_SITE_OTHER): Payer: No Typology Code available for payment source | Admitting: Gastroenterology

## 2019-05-01 ENCOUNTER — Other Ambulatory Visit: Payer: Self-pay

## 2019-05-01 ENCOUNTER — Encounter: Payer: Self-pay | Admitting: Gastroenterology

## 2019-05-01 VITALS — BP 142/82 | HR 64 | Temp 97.7°F | Ht 69.0 in | Wt 243.5 lb

## 2019-05-01 DIAGNOSIS — K760 Fatty (change of) liver, not elsewhere classified: Secondary | ICD-10-CM

## 2019-05-01 DIAGNOSIS — E669 Obesity, unspecified: Secondary | ICD-10-CM

## 2019-05-01 DIAGNOSIS — R748 Abnormal levels of other serum enzymes: Secondary | ICD-10-CM

## 2019-05-01 DIAGNOSIS — Z1211 Encounter for screening for malignant neoplasm of colon: Secondary | ICD-10-CM | POA: Diagnosis not present

## 2019-05-01 DIAGNOSIS — Z6835 Body mass index (BMI) 35.0-35.9, adult: Secondary | ICD-10-CM

## 2019-05-01 LAB — IBC + FERRITIN
Ferritin: 82.5 ng/mL (ref 22.0–322.0)
Iron: 72 ug/dL (ref 42–165)
Saturation Ratios: 15.1 % — ABNORMAL LOW (ref 20.0–50.0)
Transferrin: 341 mg/dL (ref 212.0–360.0)

## 2019-05-01 NOTE — Progress Notes (Addendum)
HPI :  48 year old male with a history of diabetes, sleep apnea, overweight, referred by Dr. Roque Cash for abnormal liver enzymes and fatty liver.  He reports liver enzymes have been elevated for a "long time". On review of chart looking back to 2013 he has had elevated ALT and AST. Initially back in 2013 ALT was in the 50s, then over time had peaked to high 200s (293) in last 2017, then has been in 100s since then. Epic shows last ALT 107 and AST 85 in February 2020. He has had labs done at Dr. Baldwin Crown office recently for this.   He denies any jaundice, denies any history of known cirrhosis. He denies any family history of liver disease. He reports being taken off statin in the past for this which did not help. He has been overweight for a long time. 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 Weight has fluctuated recently, now 42. He has a history of DM, last A1c in the 6s per his report. He denies tobacco use. He denies any heavy alcohol use in the past. Has only sparingly use of alcohol currently.   No abdominal pains. No problems with bowels. No blood in his stools. No FH of CRC. He has never had a prior colonoscopy.   He has a strong FH of CAD. Last ETT 2019 was normal. No cardiopulmonary symptoms.   Imaging done as outlined below: [place] 08/26/17: IMPRESSION: Probable fatty infiltration of liver as above.  Otherwise negative exam.  CT scan abdomen / pelvis 03/03/19: IMPRESSION: 1.  Hepatic steatosis.  2.  Coronary artery disease and aortic atherosclerosis.  Past Medical History:  Diagnosis Date   Diabetes ([place])    Dyslipidemia    Low HDL   High coronary artery calcium score    Overweight    Sleep apnea      Past Surgical History:  Procedure Laterality Date   FINGER SURGERY     VASECTOMY     Family History  Problem Relation Age of Onset   CAD Father 71       Died MI - was a smoker   Heart attack Father      Stroke Father    CAD Mother 24       CABG; post op cardiac arrest during knee surgery    Heart attack Mother    Stroke Paternal Grandmother    Social History   Tobacco Use   Smoking status: Never Smoker   Smokeless tobacco: Never Used  Substance Use Topics   Alcohol use: No   Drug use: No   Current Outpatient Medications  Medication Sig Dispense Refill   aspirin 81 MG tablet Take 81 mg by mouth daily.     atenolol (TENORMIN) 50 MG tablet Take 1 tablet by mouth every morning and 2 tablets every evening 270 tablet 3   Empagliflozin-metFORMIN HCl ER (SYNJARDY XR) 25-1000 MG TB24 Take 1 tablet by mouth daily.     Multiple Vitamins-Minerals (MULTIVITAMIN WITH MINERALS) tablet Take 1 tablet by mouth daily.     rosuvastatin (CRESTOR) 20 MG tablet Take 1 tablet (20 mg total) by mouth daily. 90 tablet 3   No current facility-administered medications for this visit.    No Known Allergies   Review of Systems: All systems reviewed and negative except where noted in HPI.   Lab Results  Component Value Date   ALT 107 (H) 09/08/2018   AST  85 (H) 09/08/2018   ALKPHOS 55 09/08/2018   BILITOT 0.3 09/08/2018    Lab Results  Component Value Date   CREATININE 0.83 07/26/2016   BUN 12 07/26/2016   NA 142 07/26/2016   K 4.4 07/26/2016   CL 103 07/26/2016   CO2 22 07/26/2016    Lab Results  Component Value Date   WBC 6.9 07/13/2016   HGB 15.5 07/13/2016   HCT 42.6 (A) 07/13/2016   MCV 81.2 07/13/2016   PLT 271 03/13/2010     Physical Exam: BP (!) 142/82 (BP Location: Left Arm, Patient Position: Sitting, Cuff Size: Large)    Pulse 64    Temp 97.7 F (36.5 C) (Oral)    Ht 5\' 9"  (1.753 m)    Wt 243 lb 8 oz (110.5 kg)    BMI 35.96 kg/m  Constitutional: Pleasant,well-developed, male in no acute distress. HEENT: Normocephalic and atraumatic. Conjunctivae are normal. No scleral icterus. Neck supple.  Cardiovascular: Normal rate, regular rhythm.  Pulmonary/chest:  Effort normal and breath sounds normal. No wheezing, rales or rhonchi. Abdominal: Soft, nondistended, protuberant, nontender. . There are no masses palpable. No hepatomegaly. Extremities: no edema Lymphadenopathy: No cervical adenopathy noted. Neurological: Alert and oriented to person place and time. Skin: Skin is warm and dry. No rashes noted. Psychiatric: Normal mood and affect. Behavior is normal.   ASSESSMENT AND PLAN: 48 year old male here for new patient consultation regarding the following:  Elevated liver enzymes / Fatty liver / Obesity - chronically elevated ALT dating back for years, peak ALT level of high 200s back in 2017, now in low 100s.  I suspect this is very likely due to fatty liver in the setting of obesity and metabolic syndrome.  He denies any significant alcohol use to cause this. Recent imaging does not show any cirrhotic change. I discussed differential diagnosis for chronic liver diseases in general, and recommend that he be screened for other chronic liver diseases with serologies as outlined to ensure nothing else going on. Will also check for immunity to hep A and B and vaccinate as needed.  He was agreeable to this.  I otherwise discussed natural history of fatty liver disease, risks for Elita Booneash and cirrhosis over time.  The best thing he can do for the situation to prevent cirrhosis is weight loss via dieting and exercise.  We discussed this issue for a bit and set some realistic goals for weight loss, I offered him referral to weight loss clinic if interested he declined for now, he is going to work on this on his own and seems motivated to do it.  I do recommend that he routinely drink coffee, which he currently does, to help prevent fibrotic change.  I will reach out to Dr. Rinaldo [facility]'s office to get his most recent LFTs that were drawn for comparison.  Based on the trend of his ALT we may consider elastography at some point time to assess for fibrotic change or consideration  for liver biopsy.  He agreed with the plan, will await the results of his labs with further recommendations.  We will plan on checking LFTs every 6 months and seeing him at least once a year for this.  Colon cancer screening - he recently turned 48 years old, we discussed the need for colon cancer screening.  He is asymptomatic but is due for routine colon cancer screening.  I discussed optical colonoscopy and what that entails.  Following discussion of risks and benefits he wants to proceed with  a colonoscopy, however is not ready to schedule it due to other complex right now.  He will contact me to schedule when he is ready.    Ileene Patrick, MD [facility]  CC: Adrian Prince, MD    ADDENDUM: Labs arrived - 02/16/19 - ALT 171, AST 95, AP 51, Alb 4.3, T bil 0.4

## 2019-05-01 NOTE — Patient Instructions (Signed)
If you are age 50 or older, your body mass index should be between 23-30. Your Body mass index is 35.96 kg/m. If this is out of the aforementioned range listed, please consider follow up with your Primary Care Provider.  If you are age 68 or younger, your body mass index should be between 19-25. Your Body mass index is 35.96 kg/m. If this is out of the aformentioned range listed, please consider follow up with your Primary Care Provider.   To help prevent the possible spread of infection to our patients, communities, and staff; we will be implementing the following measures:  As of now we are not allowing any visitors/family members to accompany you to any upcoming appointments with [facility]. If you have any concerns about this please contact our office to discuss prior to the appointment.   Please go to the lab in the basement of our building to have lab work done as you leave today. Hit "B" for basement when you get on the elevator.  When the doors open the lab is on your left.  We will call you with the results. Thank you.  We will request your lab records from Dr. Baldwin Crown office.   Thank you for entrusting me with your care and for choosing [facility], Dr. Ladera Ranch Cellar

## 2019-05-04 LAB — HEPATITIS C ANTIBODY
Hepatitis C Ab: NONREACTIVE
SIGNAL TO CUT-OFF: 0.14 (ref <1.00)

## 2019-05-04 LAB — HEPATITIS B SURFACE ANTIBODY,QUALITATIVE: Hep B S Ab: NONREACTIVE

## 2019-05-04 LAB — HEPATITIS B SURFACE ANTIGEN: Hepatitis B Surface Ag: NONREACTIVE

## 2019-05-04 LAB — IGG: IgG (Immunoglobin G), Serum: 1054 mg/dL (ref 600–1640)

## 2019-05-04 LAB — ALPHA-1-ANTITRYPSIN: A-1 Antitrypsin, Ser: 124 mg/dL (ref 83–199)

## 2019-05-04 LAB — CERULOPLASMIN: Ceruloplasmin: 25 mg/dL (ref 18–36)

## 2019-05-04 LAB — ANA: Anti Nuclear Antibody (ANA): NEGATIVE

## 2019-05-04 LAB — ANTI-SMOOTH MUSCLE ANTIBODY, IGG: Actin (Smooth Muscle) Antibody (IGG): <20 U (ref <20)

## 2019-05-04 LAB — HEPATITIS A ANTIBODY, TOTAL: Hepatitis A AB,Total: NONREACTIVE

## 2019-05-06 ENCOUNTER — Telehealth: Payer: Self-pay | Admitting: Gastroenterology

## 2019-05-06 NOTE — Telephone Encounter (Signed)
See lab results note from 05/04/19. I have attempted to call back again but voicemail is now full. At this point, reminder has been placed in epic for lft's in 3 months. He already has looked at [facility]. He just needs to be scheduled for Twinrix injections.

## 2019-05-11 ENCOUNTER — Telehealth: Payer: Self-pay

## 2019-05-11 ENCOUNTER — Telehealth: Payer: Self-pay | Admitting: Gastroenterology

## 2019-05-11 DIAGNOSIS — K760 Fatty (change of) liver, not elsewhere classified: Secondary | ICD-10-CM

## 2019-05-11 DIAGNOSIS — R748 Abnormal levels of other serum enzymes: Secondary | ICD-10-CM

## 2019-05-11 NOTE — Telephone Encounter (Signed)
-----   Message from [place] sent at 05/06/2019  2:49 PM EDT ----- This is the message from Dr Havery Moros      This message is to relay the results of your lab work. Your testing for other chronic liver diseases is negative, which is good news. Your elevated liver enzymes are likely due to the fat in the liver as we previously discussed. Your most recent labs from July showed that the ALT elevation was a bit higher than previously noted, at 171. I hope that you can make some progress with weight loss as we discussed, as this will significantly help this condition. I would like you to work on that for the next 3 months and will repeat labs at that time to see where things are going. If the elevation rises over time we may need to consider a biopsy of the liver, hopefully that is not needed however. Otherwise I tested your immunity to hepatitis A and B, and you are not immune to either of these. I am recommending a vaccination for both hepatitis A and B in our office will call you to coordinate this. Please let me know if you have any other questions in the interim, otherwise we will  order your labs to be done in another 3 months. Thanks

## 2019-05-11 NOTE — Telephone Encounter (Signed)
Patient's last office visit notes with labs faxed to patient's PCP, per his request.

## 2019-05-11 NOTE — Telephone Encounter (Signed)
Called pt to schedule Twinrix series.  Pt indicated he has had some changes at work (lost some employees) and can't schedule right now. He asked that we call him next week and he should know when he can make an appt to start Twinrix series.  Twinrix order and LFTs order entered.

## 2019-05-19 MED FILL — VASCEPA 1 GM CAPSULE: 1 | 90 days supply | Qty: 360 | Fill #0

## 2019-05-22 NOTE — Telephone Encounter (Signed)
Called and spoke to pt.  He is still not in a position to schedule his injections because of work and he just got back from a couple of days of vacation.  He will call us soon to get scheduled for the series.

## 2019-05-25 MED FILL — SYNJARDY XR 25-1000 MG TB24: 25-1000 | 90 days supply | Qty: 90 | Fill #0

## 2019-07-03 MED FILL — FREESTYLE LITE TEST STRIP: 25 days supply | Qty: 100 | Fill #0

## 2019-07-03 MED FILL — ATENOLOL 50 MG TABLET: 50 | 90 days supply | Qty: 270 | Fill #1

## 2019-07-03 MED FILL — FREESTYLE LANCETS: 90 days supply | Qty: 400 | Fill #0

## 2019-07-15 ENCOUNTER — Telehealth: Payer: Self-pay

## 2019-07-15 NOTE — Telephone Encounter (Signed)
-----   Message from Larina Bras, [place] sent at 05/06/2019 10:21 AM EDT ----- Needs LFT's completed around 08/04/2019. See lab notes from 05/04/19

## 2019-07-15 NOTE — Telephone Encounter (Signed)
Informed patient to come in for repeat lab work around 08/04/19. Patient verbalized understanding.

## 2019-08-07 ENCOUNTER — Telehealth: Payer: Self-pay

## 2019-08-07 NOTE — Telephone Encounter (Signed)
-----   Message from Cooper Render, CMA sent at 05/11/2019 10:52 AM EDT ----- Regarding: labs due Labs due in Jan 2021.  LFTs order is in.   See if [facility] wants other labs also or just LFTs ?

## 2019-08-07 NOTE — Telephone Encounter (Signed)
Called and LM for pt that he needs to go to the lab for LFTs. Letter mailed

## 2019-08-20 ENCOUNTER — Other Ambulatory Visit: Payer: Self-pay | Admitting: Cardiology

## 2019-08-20 MED FILL — VASCEPA 1 GM CAPSULE: 1 | 90 days supply | Qty: 360 | Fill #1

## 2019-08-20 MED FILL — SYNJARDY XR 25-1000 MG TB24: 25-1000 | 90 days supply | Qty: 90 | Fill #1

## 2019-09-04 ENCOUNTER — Telehealth: Payer: Self-pay | Admitting: Cardiology

## 2019-09-04 ENCOUNTER — Inpatient Hospital Stay (HOSPITAL_COMMUNITY): Admission: RE | Admit: 2019-09-04 | Payer: No Typology Code available for payment source | Source: Ambulatory Visit

## 2019-09-04 NOTE — Telephone Encounter (Signed)
Patient is calling stating he cannot take a week off work in order to be tested for [employer] and quarantine until the day of his Exercise Tolerance Test. He would like to know if there is another option. Please advise.

## 2019-09-04 NOTE — Telephone Encounter (Signed)
I contact patient, he states that he is unable to reschedule at this time- but does want a follow up visit to go over other options.  I scheduled patient to be seen 10/16/19 at 7:40 AM.  Patient verbalized understanding, thankful for call.

## 2019-09-04 NOTE — Telephone Encounter (Signed)
See if he wants to reschedule.  If not does he want a follow up office visit.

## 2019-09-04 NOTE — Telephone Encounter (Signed)
Please advise, patient cancelled ETT for today.  Thank you!

## 2019-09-14 ENCOUNTER — Ambulatory Visit (INDEPENDENT_AMBULATORY_CARE_PROVIDER_SITE_OTHER): Payer: No Typology Code available for payment source | Admitting: Family Medicine

## 2019-09-14 ENCOUNTER — Other Ambulatory Visit: Payer: Self-pay

## 2019-09-14 ENCOUNTER — Encounter: Payer: Self-pay | Admitting: Family Medicine

## 2019-09-14 DIAGNOSIS — G4733 Obstructive sleep apnea (adult) (pediatric): Secondary | ICD-10-CM | POA: Diagnosis not present

## 2019-09-14 DIAGNOSIS — Z9989 Dependence on other enabling machines and devices: Secondary | ICD-10-CM

## 2019-09-14 MED FILL — ATENOLOL 50 MG TABLET: 50 | 90 days supply | Qty: 270 | Fill #0

## 2019-09-14 NOTE — Progress Notes (Signed)
PATIENT: Marcus Dickson DOB: [birth year]  REASON FOR VISIT: follow up HISTORY FROM: patient  Virtual Visit via Telephone Note  I connected with Barrett Henle on 09/14/19 at  8:00 AM EST by telephone and verified that I am speaking with the correct person using two identifiers.   I discussed the limitations, risks, security and privacy concerns of performing an evaluation and management service by telephone and the availability of in person appointments. I also discussed with the patient that there may be a patient responsible charge related to this service. The patient expressed understanding and agreed to proceed.   History of Present Illness:  09/14/19 Marcus Dickson is a 49 y.o. male here today for follow up for OSa on CPAP. He admits that he has not been as compliant as he should. He continues to have difficulty with an air leak. He also has difficulty finding the correct tempeture for the humidity. He does note increased fatigue when he is not using CPAP.   Compliance report dated 08/11/2019 through 09/09/2019 reveals that he used CPAP 20 one of the last 30 days for compliance of 70%.  He used CPAP greater than 4 hours 15 of the last 30 days for compliance of 50%.  Average usage was 5 hours and 16 minutes.  Residual AHI was 0.9 on 6 to 16 cm of water and an EPR of 3.  There was a significant leak noted in the 95th percentile of 22.2.   History (copied from my note on 09/11/2018)  Marcus Dickson is a 49 y.o. male here today for follow up.  He is feeling well without complaints today.  His download compliance report shows he is using his CPAP 29 out of 30 days for compliance denies any percent.  He is using his machine 24 out of 30 days for compliance of 80%.  He is using his machine on average 5 hours and 11 minutes.  His AHI was 1.4 on 6 to 16 cm of water with a EPR of 3.  There is no significant leak.  He has not noticed much of a difference in his sleep.  He continues to work  different shifts however he has had a conversation with his employer to limit this. He has been using his machine since 30 May 2018.   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 Evlyn Courier a sleep apnea evaluation.  Chief complaint according to patient :DOT physical. DM, HTN, Obesity.  Sleep habits are as follows:irregular work hours determine irregular sleep times. He may work between 4 AM and until 10 PM, and he travels a lot with an Conservator, museum/gallery. Mr. Masden is not primarily a driver but his company requires a driver for each workcrew. 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   Whenever he can retreat to the bedroom he is asleep in 15 seconds and his wife reports he snores soon after - but not continuously, his wife tries to get him off his back. He can't sleep very long on his sides as his shoulders hurt. The bedroom is dark, cool at 57 F, quiet. He always wakes up at 7.30 AM while on the road or at home. - no matter when he went to bed. Even when not on the road he gets phone calls, but works mostly day shift.  He doesn't wake choking -   Average sleep duration at home 7 hours, on the road - anything possible. He wakes up congested and with a parched mouth, has a lot mucous.  Sleep medical history and family sleep history:Snoring for several years , as he gained weight - he has lost 30 pounds since 2018 and is able to keep it off. Retrognathia.  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 Non smoker, ETOH- seldomly .  Caffeine : 2 cups a day of coffee, shift worker - swing shifts.   Observations/Objective:  Televisit  Mentation: Alert oriented to time, place, history  taking.    Assessment and Plan:  49 y.o. year old male  has a past medical history of Diabetes (HCC), Dyslipidemia, High coronary artery calcium score, Overweight, and Sleep apnea. here with    ICD-10-CM   1. OSA on CPAP  G47.33 For home use only DME continuous positive airway pressure (CPAP)   Z99.89    Marcus Dickson admits that he is having more difficulty with compliance.  He feels that the inability to find a comfortable humidity setting as well as an air leak with his mask contribute to noncompliance.  We will send an order today to [facility] to assist him in finding the correct settings with his machine as well as fitting him for a new mask.  He was encouraged to continue using CPAP nightly and for greater than 4 hours each night.  We will follow-up in 3 months to assess compliance and any other needs.  He verbalizes understanding and agreement with this plan.   Orders Placed This Encounter  Procedures  . For home use only DME continuous positive airway pressure (CPAP)    Mask refitting please, patient also requesting assistance with humidity settings.    Order Specific Question:   Length of Need    Answer:   Lifetime    Order Specific Question:   Patient has OSA or probable OSA    Answer:   Yes    Order Specific Question:   Is the patient currently using CPAP in the home    Answer:   Yes    Order Specific Question:   Settings    Answer:   Other see comments    Order Specific Question:   CPAP supplies needed    Answer:   Mask, headgear, cushions, filters, heated tubing and water chamber    No orders of the defined types were placed in this encounter.    Follow Up Instructions:  I discussed the assessment and treatment plan with the patient. The patient was provided an opportunity to ask questions and all were answered. The patient agreed with the plan and demonstrated an understanding of the instructions.   The patient was advised to call back or seek an in-person  evaluation if the symptoms worsen or if the condition fails to improve as anticipated.  I provided 21 minutes of non-face-to-face time during this encounter.  Patient is located at his place of residence during televisit.  Provider is located in the office.   Shawnie Dapper, NP

## 2019-09-23 MED FILL — MELOXICAM 7.5 MG TABLET: 7.5 | 30 days supply | Qty: 30 | Fill #0

## 2019-10-16 ENCOUNTER — Ambulatory Visit: Payer: No Typology Code available for payment source | Admitting: Cardiology

## 2019-11-12 DIAGNOSIS — Z7189 Other specified counseling: Secondary | ICD-10-CM | POA: Insufficient documentation

## 2019-11-12 DIAGNOSIS — E663 Overweight: Secondary | ICD-10-CM | POA: Insufficient documentation

## 2019-11-12 NOTE — Progress Notes (Signed)
Cardiology Office Note   Date:  11/13/2019   ID:  IZEAH VOSSLER, DOB 1970/10/17, MRN [id number]  PCP:  Adrian Prince, MD  Cardiologist:   Rollene Rotunda, MD   Chief Complaint  Patient presents with  . Shortness of Breath      History of Present Illness: Marcus Dickson is a 49 y.o. male who presents for a history of coronary calcium.  We found this on screening because he has a very strong family history of early onset coronary artery disease. He had a negative POET (Plain Old Exercise Treadmill) last in Feb of 2019.    Since I last saw him he has had a little shortness of breath with activity such as climbing stairs.  He has not been as active because of the virus.  He is wanting to go back to the gym.  He has not been having any chest pressure, neck or arm discomfort.  He has had some fluctuating weights.  He is not had any edema.  He has had no new PND or orthopnea.  He has not had any palpitations, presyncope or syncope.   Past Medical History:  Diagnosis Date  . Diabetes (HCC)   . Dyslipidemia    Low HDL  . High coronary artery calcium score   . Overweight   . Sleep apnea     Past Surgical History:  Procedure Laterality Date  . FINGER SURGERY    . VASECTOMY       Current Outpatient Medications  Medication Sig Dispense Refill  . aspirin 81 MG tablet Take 81 mg by mouth daily.    Marland Kitchen atenolol (TENORMIN) 50 MG tablet TAKE 1 TABLET BY MOUTH EVERY MORNING AND 2 TABLETS EVERY EVENING 270 tablet 3  . Empagliflozin-metFORMIN HCl ER (SYNJARDY XR) 25-1000 MG TB24 Take 1 tablet by mouth daily.    . Multiple Vitamins-Minerals (MULTIVITAMIN WITH MINERALS) tablet Take 1 tablet by mouth daily.    . metoprolol tartrate (LOPRESSOR) 100 MG tablet Take 1 tablet (100 mg total) by mouth once for 1 dose. Take 1 tablet 2 hours before your CT scan. 1 tablet 0   No current facility-administered medications for this visit.    Allergies:   Patient has no known allergies.   ROS:   Please see the history of present illness.   Otherwise, review of systems are positive for none.   All other systems are reviewed and negative.    PHYSICAL EXAM: VS:  BP (!) 158/100   Pulse 63   Ht 5\' 9"  (1.753 m)   Wt 237 lb (107.5 kg)   SpO2 98%   BMI 35.00 kg/m  , BMI Body mass index is 35 kg/m. GENERAL:  Well appearing NECK:  No jugular venous distention, waveform within normal limits, carotid upstroke brisk and symmetric, no bruits, no thyromegaly LUNGS:  Clear to auscultation bilaterally CHEST:  Unremarkable HEART:  PMI not displaced or sustained,S1 and S2 within normal limits, no S3, no S4, no clicks, no rubs, no murmurs ABD:  Flat, positive bowel sounds normal in frequency in pitch, no bruits, no rebound, no guarding, no midline pulsatile mass, no hepatomegaly, no splenomegaly EXT:  2 plus pulses throughout, no edema, no cyanosis no clubbing   EKG:  EKG is ordered today. The ekg ordered today demonstrates sinus rhythm, rate 63, right axis deviation, poor anterior R wave progression, no acute ST-T wave changes.   Recent Labs: No results found for requested labs within last 8760 hours.  Lipid Panel    Component Value Date/Time   CHOL 127 09/08/2018 0944   TRIG 189 (H) 09/08/2018 0944   HDL 36 (L) 09/08/2018 0944   CHOLHDL 3.5 09/08/2018 0944   CHOLHDL 3.8 07/24/2016 0923   VLDL 26 07/24/2016 0923   LDLCALC 53 09/08/2018 0944      Wt Readings from Last 3 Encounters:  11/13/19 237 lb (107.5 kg)  05/01/19 243 lb 8 oz (110.5 kg)  09/11/18 248 lb (112.5 kg)      Other studies Reviewed: Additional studies/ records that were reviewed today include: Labs. Review of the above records demonstrates:  Please see elsewhere in the note.     ASSESSMENT AND PLAN:   CORONARY CALCIUM:     He had a negative POET (Plain Old Exercise Treadmill) in 2019.   However, he has a significantly elevated calcium score given this and the shortness of breath I plan on checking a  coronary CTA.  He also has a 40 mm aorta which will be evaluated at that time.  ENLARGED AORTA: He had an enlarged aortic root on CT years ago.  This will be evaluated at that time of his CT coronary angiogram.  OVERWEIGHT:     We talked about diet and exercise.  DM:   A1c was 6.4.  He will continue with the meds as listed.  This is much improved.   DYSLIPIDEMIA:      His LDL was 156.  I spoke with Dr. Forde Dandy who has tried to start him on bempedoic acid but we are not sure that this will be covered.  He might better be served by PCSK9 inhibitor and I will wait for the results of the CT to make this determination.   SLEEP APNEA: He is being treated by Dr. Brett Fairy.    HTN: Blood pressure is elevated.  It was the other day at Dr. Duard Brady office.  He is going to keep a blood pressure diary and send those to me.  I would have a low threshold this heart ACE inhibitor or ARB.   COVID EDUCATION: He has been vaccinated.  Current medicines are reviewed at length with the patient today.  The patient does not have concerns regarding medicines.  The following changes have been made:  no change  Labs/ tests ordered today include:   Orders Placed This Encounter  Procedures  . CT CORONARY MORPH W/CTA COR W/SCORE W/CA W/CM &/OR WO/CM  . CT CORONARY FRACTIONAL FLOW RESERVE DATA PREP  . CT CORONARY FRACTIONAL FLOW RESERVE FLUID ANALYSIS  . EKG 12-Lead     Disposition:   FU with me in one year.     Signed, Minus Breeding, MD  11/13/2019 10:21 AM    [facility]

## 2019-11-13 ENCOUNTER — Other Ambulatory Visit: Payer: Self-pay

## 2019-11-13 ENCOUNTER — Encounter: Payer: Self-pay | Admitting: Cardiology

## 2019-11-13 ENCOUNTER — Ambulatory Visit (INDEPENDENT_AMBULATORY_CARE_PROVIDER_SITE_OTHER): Payer: No Typology Code available for payment source | Admitting: Cardiology

## 2019-11-13 VITALS — BP 158/100 | HR 63 | Ht 69.0 in | Wt 237.0 lb

## 2019-11-13 DIAGNOSIS — R0602 Shortness of breath: Secondary | ICD-10-CM

## 2019-11-13 DIAGNOSIS — R931 Abnormal findings on diagnostic imaging of heart and coronary circulation: Secondary | ICD-10-CM

## 2019-11-13 DIAGNOSIS — G4730 Sleep apnea, unspecified: Secondary | ICD-10-CM

## 2019-11-13 DIAGNOSIS — E1169 Type 2 diabetes mellitus with other specified complication: Secondary | ICD-10-CM

## 2019-11-13 DIAGNOSIS — I10 Essential (primary) hypertension: Secondary | ICD-10-CM

## 2019-11-13 DIAGNOSIS — E663 Overweight: Secondary | ICD-10-CM

## 2019-11-13 DIAGNOSIS — Z7189 Other specified counseling: Secondary | ICD-10-CM

## 2019-11-13 MED ORDER — METOPROLOL TARTRATE 100 MG PO TABS: 100.0000 mg | ORAL_TABLET | Freq: Once | ORAL | 0 refills | Status: DC

## 2019-11-13 MED FILL — METOPROLOL TARTRATE 100 MG: 100 | 1 days supply | Qty: 1 | Fill #0

## 2019-11-13 NOTE — Patient Instructions (Addendum)
Medication Instructions:  TAKE 100MG  OF METOPROLOL 2 HOURS BEFORE YOUR CTA SCAN *If you need a refill on your cardiac medications before your next appointment, please call your pharmacy*  Lab Work: NONE ORDERED THIS VISIT  Testing/Procedures: CORONARY CTA  Follow-Up: At [facility], you and your health needs are our priority.  As part of our continuing mission to provide you with exceptional heart care, we have created designated Provider Care Teams.  These Care Teams include your primary Cardiologist (physician) and Advanced Practice Providers (APPs -  Physician Assistants and Nurse Practitioners) who all work together to provide you with the care you need, when you need it.  Your next appointment:   12 month(s)  You will receive a reminder letter in the mail two months in advance. If you don't receive a letter, please call our office to schedule the follow-up appointment.  The format for your next appointment:   In Person  Provider:   [facility], MD  Other Instructions: Your cardiac CT will be scheduled at the below location:   [facility] [address] [place], [place] [phone]   If scheduled at [facility], please arrive at the [facility] main entrance of [facility] 30 minutes prior to test start time. Proceed to the [facility] Radiology Department (first floor) to check-in and test prep.  Please follow these instructions carefully (unless otherwise directed):  Hold all erectile dysfunction medications at least 3 days (72 hrs) prior to test.  On the Night Before the Test: . Be sure to Drink plenty of water. . Do not consume any caffeinated/decaffeinated beverages or chocolate 12 hours prior to your test. . Do not take any antihistamines 12 hours prior to your test.  On the Day of the Test: . Drink plenty of water. Do not drink any water within one hour of the test. . Do not eat any food 4 hours prior to the  test. . You may take your regular medications prior to the test.  . Take metoprolol (Lopressor) 100mg  two hours prior to test.      After the Test: . Drink plenty of water. . After receiving IV contrast, you may experience a mild flushed feeling. This is normal. . On occasion, you may experience a mild rash up to 24 hours after the test. This is not dangerous. If this occurs, you can take Benadryl 25 mg and increase your fluid intake. . If you experience trouble breathing, this can be serious. If it is severe call 911 IMMEDIATELY. If it is mild, please call our office. . If you take any of these medications: Glipizide/Metformin, Avandament, Glucavance, please do not take 48 hours after completing test unless otherwise instructed.   Once we have confirmed authorization from your insurance company, we will call you to set up a date and time for your test.   For non-scheduling related questions, please contact the cardiac imaging nurse navigator should you have any questions/concerns: [facility], RN Navigator Cardiac Imaging Heart and Vascular Services [phone] office  For scheduling needs, including cancellations and rescheduling, please call [phone].

## 2019-11-19 ENCOUNTER — Other Ambulatory Visit: Payer: Self-pay

## 2019-11-19 DIAGNOSIS — Z01812 Encounter for preprocedural laboratory examination: Secondary | ICD-10-CM

## 2019-11-19 NOTE — Progress Notes (Signed)
BMP order placed for CTA

## 2019-11-24 LAB — BASIC METABOLIC PANEL
BUN/Creatinine Ratio: 24 — ABNORMAL HIGH (ref 9–20)
BUN: 18 mg/dL (ref 6–24)
CO2: 21 mmol/L (ref 20–29)
Calcium: 9.4 mg/dL (ref 8.7–10.2)
Chloride: 103 mmol/L (ref 96–106)
Creatinine, Ser: 0.75 mg/dL — ABNORMAL LOW (ref 0.76–1.27)
GFR calc Af Amer: 124 mL/min/{1.73_m2} (ref >59)
GFR calc non Af Amer: 107 mL/min/{1.73_m2} (ref >59)
Glucose: 99 mg/dL (ref 65–99)
Potassium: 4.4 mmol/L (ref 3.5–5.2)
Sodium: 140 mmol/L (ref 134–144)

## 2019-11-25 ENCOUNTER — Other Ambulatory Visit (HOSPITAL_COMMUNITY): Payer: Self-pay | Admitting: Emergency Medicine

## 2019-11-25 ENCOUNTER — Telehealth (HOSPITAL_COMMUNITY): Payer: Self-pay | Admitting: Emergency Medicine

## 2019-11-25 DIAGNOSIS — I77810 Thoracic aortic ectasia: Secondary | ICD-10-CM

## 2019-11-25 NOTE — Telephone Encounter (Signed)
Attempted to call patient regarding upcoming cardiac CT appointment. °Left message on voicemail with name and callback number °Joye Wesenberg RN Navigator Cardiac Imaging ° Heart and Vascular Services °[phone] Office °[phone] Cell ° °

## 2019-11-26 ENCOUNTER — Other Ambulatory Visit: Payer: Self-pay

## 2019-11-26 ENCOUNTER — Ambulatory Visit
Admission: RE | Admit: 2019-11-26 | Discharge: 2019-11-26 | Disposition: A | Discharge status: Home / Self Care | Payer: No Typology Code available for payment source | Source: Ambulatory Visit | Attending: Cardiology | Admitting: Cardiology

## 2019-11-26 DIAGNOSIS — I2510 Atherosclerotic heart disease of native coronary artery without angina pectoris: Secondary | ICD-10-CM | POA: Diagnosis not present

## 2019-11-26 DIAGNOSIS — R0602 Shortness of breath: Secondary | ICD-10-CM | POA: Diagnosis not present

## 2019-11-26 HISTORY — DX: Essential (primary) hypertension: I10

## 2019-11-26 MED ORDER — IOHEXOL 350 MG/ML SOLN
125.0000 mL | Freq: Once | INTRAVENOUS | Status: AC | PRN
  Administered 2019-11-26: 125 mL via INTRAVENOUS

## 2019-11-26 MED ORDER — METOPROLOL TARTRATE 5 MG/5ML IV SOLN: 10.0000 mg | Freq: Once | INTRAVENOUS | Status: DC

## 2019-11-26 MED ORDER — NITROGLYCERIN 0.4 MG SL SUBL
0.8000 mg | SUBLINGUAL_TABLET | Freq: Once | SUBLINGUAL | Status: AC
  Administered 2019-11-26: 14:00:00 0.8 mg via SUBLINGUAL

## 2019-11-26 MED FILL — SYNJARDY XR 25-1000 MG TB24: 25-1000 | 90 days supply | Qty: 90 | Fill #2

## 2019-11-26 MED FILL — VASCEPA 1 GM CAPSULE: 1 | 90 days supply | Qty: 360 | Fill #2

## 2019-11-26 NOTE — Progress Notes (Signed)
CT heart scan complete. Patient denies headachess & dizziness. IV removed, snack given.

## 2019-11-27 ENCOUNTER — Telehealth: Payer: Self-pay | Admitting: Cardiology

## 2019-11-27 NOTE — Telephone Encounter (Signed)
Marcus Dickson is calling requesting Dr. Antoine Poche give him a call to discuss his CT results. Please advise.

## 2019-11-27 NOTE — Telephone Encounter (Signed)
Spoke with patient. Informed patient final results are not available that have been interpreted by Dr. Antoine Poche. Patient had because they were released to [facility] and he had questions. Patient verbalized understanding that results will be called into him by MD or RN as soon as they are ready. Patient is going to send in updated blood pressure logs for Dr. Antoine Poche to review.

## 2019-11-30 ENCOUNTER — Telehealth: Payer: Self-pay | Admitting: Cardiology

## 2019-11-30 NOTE — Telephone Encounter (Signed)
Patient calling about CT results. States he has a few questions for Dr. Jenene Slicker nurse.

## 2019-11-30 NOTE — Telephone Encounter (Signed)
Returned call to patient, advised Dr. Maricao Lions to review and awaiting FFR portion.  Advised once review, primary nurse will call with results.  Patient aware and verbalized understanding.

## 2019-12-02 NOTE — Telephone Encounter (Signed)
Advised patient waiting for Dr Antoine Poche to review since additional testing (FFR) Will forward to Dr Antoine Poche for review

## 2019-12-02 NOTE — Telephone Encounter (Signed)
I spoke with the patient. 

## 2019-12-02 NOTE — Telephone Encounter (Signed)
Patient's wife called to check on status of test results.  Please call Marcus Dickson with results.

## 2019-12-03 ENCOUNTER — Other Ambulatory Visit: Payer: Self-pay

## 2019-12-03 DIAGNOSIS — I10 Essential (primary) hypertension: Secondary | ICD-10-CM

## 2019-12-03 MED ORDER — LOSARTAN POTASSIUM 25 MG PO TABS: 25.0000 mg | ORAL_TABLET | Freq: Every day | ORAL | 3 refills | Status: DC

## 2019-12-03 MED FILL — LOSARTAN POTASSIUM 25 MG TA: 25 | 90 days supply | Qty: 90 | Fill #0

## 2019-12-03 NOTE — Progress Notes (Signed)
Rollene Rotunda, MD  12/02/2019 4:20 PM EDT    I called the patient with his results and reviewed the fact that he has moderate but nonobstructive disease in the circumflex and LAD as judged by FFR and CT. His blood pressure needs to be better controlled. I want to start Cozaar 25 mg daily. Also given this he should start a PCSK9 inhibitor. He wants to go back to Dr. Evlyn Kanner for this so I will send this message to him. I would like to see him back in 3 months. I probably will do treadmill testing at least yearly.   Order placed for Cozaar 25mg  daily. Message sent to scheduling to schedule for a 3 month appt.

## 2019-12-14 ENCOUNTER — Ambulatory Visit: Payer: No Typology Code available for payment source | Admitting: Family Medicine

## 2019-12-18 MED FILL — PRALUENT 150 MG/ML SOAJ: 150 | 28 days supply | Qty: 2 | Fill #0

## 2020-02-11 DIAGNOSIS — I2510 Atherosclerotic heart disease of native coronary artery without angina pectoris: Secondary | ICD-10-CM | POA: Insufficient documentation

## 2020-02-11 NOTE — Progress Notes (Signed)
Cardiology Office Note   Date:  02/12/2020   ID:  Marcus Dickson, DOB Sep 15, 1970, MRN [id number]  PCP:  Adrian Prince, MD  Cardiologist:   Rollene Rotunda, MD   Chief Complaint  Patient presents with  . Coronary Artery Disease      History of Present Illness: Marcus Dickson is a 49 y.o. male who presents for a history of coronary calcium.  We found this on screening because he has a very strong family history of early onset coronary artery disease. He had a negative POET (Plain Old Exercise Treadmill) last in Feb of 2019.  He had dyspnea.  I sent him for coronary CTA.  He had non obstructive disease as below.    He is actually done well.  He is really graft risk reduction and he is exercising routinely.  A lot of this is aerobic activity.  The patient denies any new symptoms such as chest discomfort, neck or arm discomfort. There has been no new shortness of breath, PND or orthopnea. There have been no reported palpitations, presyncope or syncope.  He has lost inches from his waist.   Past Medical History:  Diagnosis Date  . Diabetes (HCC)   . Dyslipidemia    Low HDL  . High coronary artery calcium score   . Hypertension   . Overweight   . Sleep apnea     Past Surgical History:  Procedure Laterality Date  . FINGER SURGERY    . VASECTOMY       Current Outpatient Medications  Medication Sig Dispense Refill  . Alirocumab (PRALUENT) 150 MG/ML SOAJ Inject 150 mg/mL into the skin every 14 (fourteen) days. Inject 150 mg/ml into the skin every 2 weeks.    Marland Kitchen aspirin 81 MG tablet Take 81 mg by mouth daily.    Marland Kitchen atenolol (TENORMIN) 50 MG tablet TAKE 1 TABLET BY MOUTH EVERY MORNING AND 2 TABLETS EVERY EVENING 270 tablet 3  . Empagliflozin-metFORMIN HCl ER (SYNJARDY XR) 25-1000 MG TB24 Take 1 tablet by mouth daily.    Marland Kitchen losartan (COZAAR) 50 MG tablet Take 1 tablet (50 mg total) by mouth daily. 90 tablet 3   No current facility-administered medications for this visit.     Allergies:   Patient has no known allergies.   ROS:  Please see the history of present illness.   Otherwise, review of systems are positive for none.   All other systems are reviewed and negative.    PHYSICAL EXAM: VS:  BP 130/60   Pulse (!) 57   Temp (!) 94.3 F (34.6 C)   Ht 5\' 9"  (1.753 m)   Wt 240 lb 3.2 oz (109 kg)   SpO2 96%   BMI 35.47 kg/m  , BMI Body mass index is 35.47 kg/m. GENERAL:  Well appearing NECK:  No jugular venous distention, waveform within normal limits, carotid upstroke brisk and symmetric, no bruits, no thyromegaly LUNGS:  Clear to auscultation bilaterally CHEST:  Unremarkable HEART:  PMI not displaced or sustained,S1 and S2 within normal limits, no S3, no S4, no clicks, no rubs, no murmurs ABD:  Flat, positive bowel sounds normal in frequency in pitch, no bruits, no rebound, no guarding, no midline pulsatile mass, no hepatomegaly, no splenomegaly EXT:  2 plus pulses throughout, no edema, no cyanosis no clubbing   CT:    Coronary Arteries:  Normal coronary origin.  Left dominance.  RCA is a small non-dominant artery giving rise to a small RV branch. There  is minimal calcification in the mid RCA causing minimal non obstructive CAD (0-24%)  Left main is a large artery that gives rise to LAD and LCX arteries.  LAD is a large vessel that has heavily calcified plaque in the proximal and mid segments causing at least moderate stenosis (50-69%). Challenging to rule out severe stenosis due to significant calcification causing calcium/blooming artifacts.  LCX is a dominant artery that gives rise 3 obtuse marginal branches prior to suppling the PDA and PLA. There is severe calcified plaque in the mid and distal LCx (prior to PDA takeoff), causing moderate stenosis (50-69%).  EKG:  EKG is ordered today. The ekg ordered today demonstrates sinus rhythm, rate 63, right axis deviation, poor anterior R wave progression, no acute ST-T wave  changes.   Recent Labs: 11/23/2019: BUN 18; Creatinine, Ser 0.75; Potassium 4.4; Sodium 140    Lipid Panel    Component Value Date/Time   CHOL 127 09/08/2018 0944   TRIG 189 (H) 09/08/2018 0944   HDL 36 (L) 09/08/2018 0944   CHOLHDL 3.5 09/08/2018 0944   CHOLHDL 3.8 07/24/2016 0923   VLDL 26 07/24/2016 0923   LDLCALC 53 09/08/2018 0944      Wt Readings from Last 3 Encounters:  02/12/20 240 lb 3.2 oz (109 kg)  11/13/19 237 lb (107.5 kg)  05/01/19 243 lb 8 oz (110.5 kg)      Other studies Reviewed: Additional studies/ records that were reviewed today include: Labs. Review of the above records demonstrates:  Please see elsewhere in the note.     ASSESSMENT AND PLAN:   CAD:   The patient has no new sypmtoms.  No further cardiovascular testing is indicated.  We will continue with aggressive risk reduction and meds as listed.  We had a long discussion about the nonobstructive disease that he has.  The FFR did not suggest flow-limiting stenosis.  I am going to follow him closely and do another POET (Plain Old Exercise Treadmill) next year.  He will let me know if he ever has any symptoms.  ENLARGED AORTA:   This was 4 cm in April and he should have follow up  CT in April of next year.    OVERWEIGHT:     We talked a lot about goals and fitness trumps absolute weight or BMI.   DM:   A1c was 6.4 and is followed by Dr. Evlyn Kanner.   DYSLIPIDEMIA:      His LDL was started on PCSK9 inhibitor.  He is on the other meds as listed above.  COVID for follow-up with Dr. Evlyn Kanner.   SLEEP APNEA:   He is being followed by Dr. Vickey Huger.    HTN: Blood pressure is slightly elevated on his blood pressure.  I am going to increase his Cozaar to 50 mg daily and he will continue to keep a diary.   COVID EDUCATION: He has been vaccinated.  Current medicines are reviewed at length with the patient today.  The patient does not have concerns regarding medicines.  The following changes have been made:   no change  Labs/ tests ordered today include:   Orders Placed This Encounter  Procedures  . EXERCISE TOLERANCE TEST (ETT)     Disposition:   FU with me in one year.     Signed, Rollene Rotunda, MD  02/12/2020 11:57 AM    [facility]

## 2020-02-12 ENCOUNTER — Ambulatory Visit (INDEPENDENT_AMBULATORY_CARE_PROVIDER_SITE_OTHER): Payer: No Typology Code available for payment source | Admitting: Cardiology

## 2020-02-12 ENCOUNTER — Encounter: Payer: Self-pay | Admitting: Cardiology

## 2020-02-12 ENCOUNTER — Other Ambulatory Visit: Payer: Self-pay | Admitting: Cardiology

## 2020-02-12 ENCOUNTER — Other Ambulatory Visit: Payer: Self-pay

## 2020-02-12 VITALS — BP 130/60 | HR 57 | Temp 94.3°F | Ht 69.0 in | Wt 240.2 lb

## 2020-02-12 DIAGNOSIS — E663 Overweight: Secondary | ICD-10-CM

## 2020-02-12 DIAGNOSIS — I7789 Other specified disorders of arteries and arterioles: Secondary | ICD-10-CM

## 2020-02-12 DIAGNOSIS — E785 Hyperlipidemia, unspecified: Secondary | ICD-10-CM | POA: Diagnosis not present

## 2020-02-12 DIAGNOSIS — I2510 Atherosclerotic heart disease of native coronary artery without angina pectoris: Secondary | ICD-10-CM

## 2020-02-12 DIAGNOSIS — I10 Essential (primary) hypertension: Secondary | ICD-10-CM

## 2020-02-12 DIAGNOSIS — E118 Type 2 diabetes mellitus with unspecified complications: Secondary | ICD-10-CM

## 2020-02-12 DIAGNOSIS — Z7189 Other specified counseling: Secondary | ICD-10-CM

## 2020-02-12 MED ORDER — LOSARTAN POTASSIUM 50 MG PO TABS: 50.0000 mg | ORAL_TABLET | Freq: Every day | ORAL | 3 refills | Status: DC

## 2020-02-12 MED FILL — LOSARTAN POTASSIUM 50 MG TA: 50 | 90 days supply | Qty: 90 | Fill #0

## 2020-02-12 NOTE — Patient Instructions (Signed)
Medication Instructions:  Increase Cozaar to 50 mg daily   *If you need a refill on your cardiac medications before your next appointment, please call your pharmacy*   Testing/Procedures: Your physician has requested that you have an exercise tolerance test, this is a screening tool to track your fitness level. This test evaluates the your exercise capacity by measuring cardiovascular response to exercise, the stress response is induced by exercise (exercise-treadmill).  Graded exercise test is also known as maximal exercise test or stress EKG test  . Please also follow instruction sheet given.   Follow-Up: At [facility], you and your health needs are our priority.  As part of our continuing mission to provide you with exceptional heart care, we have created designated Provider Care Teams.  These Care Teams include your primary Cardiologist (physician) and Advanced Practice Providers (APPs -  Physician Assistants and Nurse Practitioners) who all work together to provide you with the care you need, when you need it.  We recommend signing up for the patient portal called "MyChart".  Sign up information is provided on this After Visit Summary.  MyChart is used to connect with patients for Virtual Visits (Telemedicine).  Patients are able to view lab/test results, encounter notes, upcoming appointments, etc.  Non-urgent messages can be sent to your provider as well.   To learn more about what you can do with MyChart, go to [email or web address].    Your next appointment:   12 month(s)  The format for your next appointment:   In Person  Provider:   Rollene Rotunda, MD

## 2020-02-22 MED FILL — SYNJARDY XR 25-1000 MG TB24: 25-1000 | 90 days supply | Qty: 90 | Fill #3

## 2020-02-22 MED FILL — VASCEPA 1 GM CAPSULE: 1 | 90 days supply | Qty: 360 | Fill #3

## 2020-02-24 MED FILL — PRALUENT 150 MG/ML SOAJ: 150 | 84 days supply | Qty: 6 | Fill #1

## 2020-04-05 MED FILL — ATENOLOL 50 MG TABLET: 50 | 90 days supply | Qty: 270 | Fill #2

## 2020-04-15 MED FILL — FLUARIX QUADRIVALENT 0.5 ML: 0.5 | 1 days supply | Qty: 1 | Fill #0

## 2020-05-23 ENCOUNTER — Other Ambulatory Visit (HOSPITAL_COMMUNITY): Payer: Self-pay | Admitting: Endocrinology

## 2020-05-23 MED FILL — SYNJARDY XR 25-1000 MG TB24: 25-1000 | 90 days supply | Qty: 90 | Fill #0

## 2020-05-23 MED FILL — LOSARTAN POTASSIUM 50 MG TA: 50 | 90 days supply | Qty: 90 | Fill #1

## 2020-05-23 MED FILL — VASCEPA 1 GM CAPSULE: 1 | 90 days supply | Qty: 360 | Fill #0

## 2020-06-03 ENCOUNTER — Other Ambulatory Visit (HOSPITAL_COMMUNITY): Payer: Self-pay | Admitting: Endocrinology

## 2020-06-03 MED FILL — PRALUENT 150 MG/ML SOAJ: 150 | 84 days supply | Qty: 6 | Fill #0

## 2020-06-15 ENCOUNTER — Other Ambulatory Visit (HOSPITAL_COMMUNITY): Payer: Self-pay | Admitting: Endocrinology

## 2020-06-15 MED FILL — BENZONATATE 100 MG CAPS: 100 | 30 days supply | Qty: 90 | Fill #0

## 2020-07-14 ENCOUNTER — Other Ambulatory Visit: Payer: Self-pay | Admitting: Infectious Diseases

## 2020-07-14 DIAGNOSIS — I10 Essential (primary) hypertension: Secondary | ICD-10-CM

## 2020-07-14 DIAGNOSIS — U071 COVID-19: Secondary | ICD-10-CM

## 2020-07-14 DIAGNOSIS — I25119 Atherosclerotic heart disease of native coronary artery with unspecified angina pectoris: Secondary | ICD-10-CM

## 2020-07-14 DIAGNOSIS — E1065 Type 1 diabetes mellitus with hyperglycemia: Secondary | ICD-10-CM

## 2020-07-14 NOTE — Progress Notes (Signed)
I connected by phone with Marcus Dickson on 07/14/2020 at 9:12 PM to discuss the potential use of a new treatment for mild to moderate COVID-19 viral infection in non-hospitalized patients.  This patient is a 49 y.o. male that meets the FDA criteria for Emergency Use Authorization of COVID monoclonal antibody casirivimab/imdevimab, bamlanivimab/etesevimab, or sotrovimab.  Has a (+) direct SARS-CoV-2 viral test result  Has mild or moderate COVID-19   Is NOT hospitalized due to COVID-19  Is within 10 days of symptom onset  Has at least one of the high risk factor(s) for progression to severe COVID-19 and/or hospitalization as defined in EUA.  Specific high risk criteria : BMI > 25, Diabetes and Cardiovascular disease or hypertension   I have spoken and communicated the following to the patient or parent/caregiver regarding COVID monoclonal antibody treatment:  1. FDA has authorized the emergency use for the treatment of mild to moderate COVID-19 in adults and pediatric patients with positive results of direct SARS-CoV-2 viral testing who are 69 years of age and older weighing at least 40 kg, and who are at high risk for progressing to severe COVID-19 and/or hospitalization.  2. The significant known and potential risks and benefits of COVID monoclonal antibody, and the extent to which such potential risks and benefits are unknown.  3. Information on available alternative treatments and the risks and benefits of those alternatives, including clinical trials.  4. Patients treated with COVID monoclonal antibody should continue to self-isolate and use infection control measures (e.g., wear mask, isolate, social distance, avoid sharing personal items, clean and disinfect "high touch" surfaces, and frequent handwashing) according to CDC guidelines.   5. The patient or parent/caregiver has the option to accept or refuse COVID monoclonal antibody treatment.  After reviewing this information with  the patient, the patient has agreed to receive one of the available covid 19 monoclonal antibodies and will be provided an appropriate fact sheet prior to infusion. [name], NP 07/14/2020 9:12 PM

## 2020-07-16 ENCOUNTER — Ambulatory Visit (HOSPITAL_COMMUNITY)
Admission: RE | Admit: 2020-07-16 | Discharge: 2020-07-16 | Disposition: A | Discharge status: Home / Self Care | Payer: No Typology Code available for payment source | Source: Ambulatory Visit | Attending: Pulmonary Disease | Admitting: Pulmonary Disease

## 2020-07-16 DIAGNOSIS — I25119 Atherosclerotic heart disease of native coronary artery with unspecified angina pectoris: Secondary | ICD-10-CM | POA: Diagnosis present

## 2020-07-16 DIAGNOSIS — I10 Essential (primary) hypertension: Secondary | ICD-10-CM | POA: Insufficient documentation

## 2020-07-16 DIAGNOSIS — E1065 Type 1 diabetes mellitus with hyperglycemia: Secondary | ICD-10-CM | POA: Insufficient documentation

## 2020-07-16 DIAGNOSIS — U071 COVID-19: Secondary | ICD-10-CM | POA: Diagnosis present

## 2020-07-16 MED ORDER — ALBUTEROL SULFATE HFA 108 (90 BASE) MCG/ACT IN AERS: 2.0000 | INHALATION_SPRAY | Freq: Once | RESPIRATORY_TRACT | Status: DC | PRN

## 2020-07-16 MED ORDER — SODIUM CHLORIDE 0.9 % IV SOLN: Freq: Once | INTRAVENOUS | Status: AC

## 2020-07-16 MED ORDER — METHYLPREDNISOLONE SODIUM SUCC 125 MG IJ SOLR: 125.0000 mg | Freq: Once | INTRAMUSCULAR | Status: DC | PRN

## 2020-07-16 MED ORDER — FAMOTIDINE IN NACL 20-0.9 MG/50ML-% IV SOLN: 20.0000 mg | Freq: Once | INTRAVENOUS | Status: DC | PRN

## 2020-07-16 MED ORDER — DIPHENHYDRAMINE HCL 50 MG/ML IJ SOLN: 50.0000 mg | Freq: Once | INTRAMUSCULAR | Status: DC | PRN

## 2020-07-16 MED ORDER — EPINEPHRINE 0.3 MG/0.3ML IJ SOAJ: 0.3000 mg | Freq: Once | INTRAMUSCULAR | Status: DC | PRN

## 2020-07-16 MED ORDER — SODIUM CHLORIDE 0.9 % IV SOLN: INTRAVENOUS | Status: DC | PRN

## 2020-07-16 NOTE — Progress Notes (Signed)
Patient reviewed Fact Sheet for Patients, Parents, and Caregivers for Emergency Use Authorization (EUA) of bamlanivimab and etesevimab for the Treatment of Coronavirus. Patient also reviewed and is agreeable to the estimated cost of treatment. Patient is agreeable to proceed.   

## 2020-07-16 NOTE — Progress Notes (Signed)
  Diagnosis: COVID-19  Physician: Dr. Delford Field   Procedure: [facility] Infusion [facility] Med: bamlanivimab\etesevimab infusion - Provided patient with bamlanimivab\etesevimab fact sheet for patients, parents and caregivers prior to infusion.     Complications: No immediate complications noted.  Discharge: Discharged home   Marcus Dickson 07/16/2020

## 2020-07-16 NOTE — Discharge Instructions (Signed)
10 Things You Can Do to Manage Your COVID-19 Symptoms at Home If you have possible or confirmed COVID-19: 1. Stay home from work and school. And stay away from other public places. If you must go out, avoid using any kind of public transportation, ridesharing, or taxis. 2. Monitor your symptoms carefully. If your symptoms get worse, call your healthcare provider immediately. 3. Get rest and stay hydrated. 4. If you have a medical appointment, call the healthcare provider ahead of time and tell them that you have or may have COVID-19. 5. For medical emergencies, call 911 and notify the dispatch personnel that you have or may have COVID-19. 6. Cover your cough and sneezes with a tissue or use the inside of your elbow. 7. Wash your hands often with soap and water for at least 20 seconds or clean your hands with an alcohol-based hand sanitizer that contains at least 60% alcohol. 8. As much as possible, stay in a specific room and away from other people in your home. Also, you should use a separate bathroom, if available. If you need to be around other people in or outside of the home, wear a mask. 9. Avoid sharing personal items with other people in your household, like dishes, towels, and bedding. 10. Clean all surfaces that are touched often, like counters, tabletops, and doorknobs. Use household cleaning sprays or wipes according to the label instructions. cdc.gov/coronavirus 01/28/2019 This information is not intended to replace advice given to you by your health care provider. Make sure you discuss any questions you have with your health care provider. Document Revised: 07/02/2019 Document Reviewed: 07/02/2019 Elsevier Patient Education  2020 Elsevier Inc. What types of side effects do monoclonal antibody drugs cause?  Common side effects  In general, the more common side effects caused by monoclonal antibody drugs include: . Allergic reactions, such as hives or itching . Flu-like signs and  symptoms, including chills, fatigue, fever, and muscle aches and pains . Nausea, vomiting . Diarrhea . Skin rashes . Low blood pressure   The CDC is recommending patients who receive monoclonal antibody treatments wait at least 90 days before being vaccinated.  Currently, there are no data on the safety and efficacy of mRNA COVID-19 vaccines in persons who received monoclonal antibodies or convalescent plasma as part of COVID-19 treatment. Based on the estimated half-life of such therapies as well as evidence suggesting that reinfection is uncommon in the 90 days after initial infection, vaccination should be deferred for at least 90 days, as a precautionary measure until additional information becomes available, to avoid interference of the antibody treatment with vaccine-induced immune responses. If you have any questions or concerns after the infusion please call the Advanced Practice Provider on call at [phone]. This number is ONLY intended for your use regarding questions or concerns about the infusion post-treatment side-effects.  Please do not provide this number to others for use. For return to work notes please contact your primary care provider.   If someone you know is interested in receiving treatment please have them call the COVID hotline at [phone].   

## 2020-07-19 ENCOUNTER — Other Ambulatory Visit (HOSPITAL_COMMUNITY): Payer: Self-pay | Admitting: Endocrinology

## 2020-07-19 MED FILL — BENZONATATE 100 MG CAPS: 100 | 30 days supply | Qty: 90 | Fill #1

## 2020-07-19 MED FILL — FREESTYLE LITE TEST STRIP: 87 days supply | Qty: 350 | Fill #0

## 2020-07-19 MED FILL — ATENOLOL 50 MG TABLET: 50 | 90 days supply | Qty: 270 | Fill #3

## 2020-08-23 MED FILL — VASCEPA 1 GM CAPSULE: 1 | 90 days supply | Qty: 360 | Fill #1

## 2020-08-23 MED FILL — PRALUENT 150 MG/ML SOAJ: 150 | 84 days supply | Qty: 6 | Fill #1

## 2020-08-23 MED FILL — LOSARTAN POTASSIUM 50 MG TA: 50 | 30 days supply | Qty: 30 | Fill #2

## 2020-08-23 MED FILL — SYNJARDY XR 25-1000 MG TB24: 25-1000 | 90 days supply | Qty: 90 | Fill #1

## 2020-09-20 MED FILL — LOSARTAN POTASSIUM 50 MG TA: 50 | 30 days supply | Qty: 30 | Fill #3

## 2020-10-06 ENCOUNTER — Other Ambulatory Visit (HOSPITAL_COMMUNITY): Payer: Self-pay | Admitting: Endocrinology

## 2020-10-06 MED FILL — CYCLOBENZAPRINE HCL 10 MG T: 10 | 30 days supply | Qty: 60 | Fill #0

## 2020-10-24 ENCOUNTER — Other Ambulatory Visit: Payer: Self-pay | Admitting: Cardiology

## 2020-10-24 MED FILL — LOSARTAN POTASSIUM 50 MG TA: 50 | 30 days supply | Qty: 30 | Fill #4

## 2020-10-24 MED FILL — ATENOLOL 50 MG TABLET: 50 | 90 days supply | Qty: 270 | Fill #0

## 2020-11-20 MED FILL — Losartan Potassium Tab 50 MG: ORAL | 90 days supply | Qty: 90 | Fill #0 | Status: AC

## 2020-11-20 MED FILL — Empagliflozin-Metformin HCl Tab ER 24HR 25-1000 MG: ORAL | 90 days supply | Qty: 90 | Fill #0 | Status: AC

## 2020-11-20 MED FILL — Icosapent Ethyl Cap 1 GM: ORAL | 90 days supply | Qty: 360 | Fill #0 | Status: AC

## 2020-11-20 MED FILL — Cyclobenzaprine HCl Tab 10 MG: ORAL | 30 days supply | Qty: 60 | Fill #0 | Status: AC

## 2020-11-21 ENCOUNTER — Other Ambulatory Visit (HOSPITAL_COMMUNITY): Payer: Self-pay

## 2020-11-22 ENCOUNTER — Other Ambulatory Visit (HOSPITAL_COMMUNITY): Payer: Self-pay

## 2020-12-12 MED FILL — Alirocumab Subcutaneous Solution Auto-Injector 150 MG/ML: SUBCUTANEOUS | 84 days supply | Qty: 6 | Fill #0 | Status: AC

## 2020-12-13 ENCOUNTER — Other Ambulatory Visit (HOSPITAL_COMMUNITY): Payer: Self-pay

## 2020-12-17 DIAGNOSIS — I77810 Thoracic aortic ectasia: Secondary | ICD-10-CM | POA: Insufficient documentation

## 2020-12-17 NOTE — Progress Notes (Signed)
Cardiology Office Note   Date:  12/19/2020   ID:  [name], DOB 1970-08-27, MRN [id number]  PCP:  Adrian Prince, MD  Cardiologist:   Rollene Rotunda, MD   Chief Complaint  Patient presents with  . Fatigue  . Shortness of Breath      History of Present Illness: Marcus Dickson is a 50 y.o. male who presents for a history of coronary calcium.  We found this on screening because he has a very strong family history of early onset coronary artery disease. He had a negative POET (Plain Old Exercise Treadmill) last in Feb of 2019.  He had dyspnea.  I sent him for coronary CTA.  He had non obstructive disease as below.  He does have a mildly enlarged aortic root.     Since I last sat him he has had some increased fatigue.  He says he is doing activities like going waxing his car and thinks he just has less exercise tolerance.  He is noting at times that his heart rate goes up faster when he does activities like this and he would think.  His blood pressure might be little higher in the 130/95 range.  He sometimes feels like he has to yawn or that he is a little more winded than he thinks he should be.  He is not describing any chest pressure, neck or arm discomfort.  He is not having any new palpitations, presyncope or syncope.  Past Medical History:  Diagnosis Date  . Diabetes (HCC)   . Dyslipidemia    Low HDL  . High coronary artery calcium score   . Hypertension   . Overweight   . Sleep apnea     Past Surgical History:  Procedure Laterality Date  . FINGER SURGERY    . VASECTOMY       Current Outpatient Medications  Medication Sig Dispense Refill  . Alirocumab (PRALUENT) 150 MG/ML SOAJ Inject 150 mg/mL into the skin every 14 (fourteen) days. Inject 150 mg/ml into the skin every 2 weeks.    Marland Kitchen aspirin 81 MG tablet Take 81 mg by mouth daily.    Marland Kitchen atenolol (TENORMIN) 50 MG tablet TAKE 1 TABLET BY MOUTH EVERY MORNING AND 2 TABLETS EVERY EVENING 270 tablet 3  . benzonatate  (TESSALON) 100 MG capsule TAKE 1 CAPSULE BY MOUTH THREE TIMES DAILY AS NEEDED 90 capsule 1  . cyclobenzaprine (FLEXERIL) 10 MG tablet TAKE 1 TABLET BY MOUTH TWICE DAILY AS NEEDED 60 tablet 1  . Empagliflozin-metFORMIN HCl ER 25-1000 MG TB24 Take 1 tablet by mouth daily.    Marland Kitchen glucose blood test strip USE TO TEST SUGAR FOUR TIMES DAILY. (Patient taking differently: USE TO TEST SUGAR FOUR TIMES DAILY.) 400 strip 3  . icosapent Ethyl (VASCEPA) 1 g capsule TAKE 2 CAPSULE BY MOUTH TWICE DAILY. 360 capsule 3  . losartan (COZAAR) 50 MG tablet TAKE 1 TABLET (50 MG TOTAL) BY MOUTH DAILY. 90 tablet 3  . meloxicam (MOBIC) 7.5 MG tablet Take 7.5 mg by mouth as needed.     No current facility-administered medications for this visit.    Allergies:   Patient has no known allergies.   ROS:  Please see the history of present illness.   Otherwise, review of systems are positive for none.   All other systems are reviewed and negative.    PHYSICAL EXAM: VS:  BP 120/88   Pulse 63   Ht 5\' 9"  (1.753 m)   Wt 235  lb (106.6 kg)   SpO2 96%   BMI 34.70 kg/m  , BMI Body mass index is 34.7 kg/m. GENERAL:  Well appearing NECK:  No jugular venous distention, waveform within normal limits, carotid upstroke brisk and symmetric, no bruits, no thyromegaly LUNGS:  Clear to auscultation bilaterally CHEST:  Unremarkable HEART:  PMI not displaced or sustained,S1 and S2 within normal limits, no S3, no S4, no clicks, no rubs, no murmurs ABD:  Flat, positive bowel sounds normal in frequency in pitch, no bruits, no rebound, no guarding, no midline pulsatile mass, no hepatomegaly, no splenomegaly EXT:  2 plus pulses throughout, no edema, no cyanosis no clubbing   CT:    Coronary Arteries:  Normal coronary origin.  Left dominance.  RCA is a small non-dominant artery giving rise to a small RV branch. There is minimal calcification in the mid RCA causing minimal non obstructive CAD (0-24%)  Left main is a large artery  that gives rise to LAD and LCX arteries.  LAD is a large vessel that has heavily calcified plaque in the proximal and mid segments causing at least moderate stenosis (50-69%). Challenging to rule out severe stenosis due to significant calcification causing calcium/blooming artifacts.  LCX is a dominant artery that gives rise 3 obtuse marginal branches prior to suppling the PDA and PLA. There is severe calcified plaque in the mid and distal LCx (prior to PDA takeoff), causing moderate stenosis (50-69%).   EKG:  EKG is ordered today. The ekg ordered today demonstrates sinus rhythm, rate 63, right axis deviation, poor anterior R wave progression, no acute ST-T wave changes.   Recent Labs: No results found for requested labs within last 8760 hours.    Lipid Panel    Component Value Date/Time   CHOL 127 09/08/2018 0944   TRIG 189 (H) 09/08/2018 0944   HDL 36 (L) 09/08/2018 0944   CHOLHDL 3.5 09/08/2018 0944   CHOLHDL 3.8 07/24/2016 0923   VLDL 26 07/24/2016 0923   LDLCALC 53 09/08/2018 0944      Wt Readings from Last 3 Encounters:  12/19/20 235 lb (106.6 kg)  02/12/20 240 lb 3.2 oz (109 kg)  11/13/19 237 lb (107.5 kg)      Other studies Reviewed: Additional studies/ records that were reviewed today include: Labs Review of the above records demonstrates:  Please see elsewhere in the note.     ASSESSMENT AND PLAN:   CAD:   This was non obstructive however, he has some vague symptoms.  I will bring the patient back for a POET (Plain Old Exercise Test). This will allow me to screen for obstructive coronary disease, risk stratify and very importantly provide a prescription for exercise.  I had a long discussion with the patient and his wife and explained that the most important thing to follow her symptoms to look for high risk findings on stress testing and that the best intervention is risk reduction.  Toward that end we had a long discussion about diet and exercise  again.  ENLARGED AORTA:   This was 4 cm in April 2021.  I will follow this next year with a contrasted CT.  OVERWEIGHT:     I applauded his weight loss to date and encouraged more of the same.  DM:   A1c was 6.6 and followed by Dr. Evlyn Kanner.   DYSLIPIDEMIA:      His LDL 26 on PCSK9 inhibitor.   SLEEP APNEA:    He has increased fatigue and I have asked  him to get an appointment with Dr. Vickey Huger to follow-up his CPAP as it has been a while.  FATIGUE: I asked him to check with Dr. Dorthea Cove to make sure he had a recent TSH and CBC.  Otherwise as above.Marland Kitchen    HTN:   The blood pressure is slightly elevated diastolic.  I would like to see continued weight loss to bring him to target as I do not want we have further meds possibly contributing to his fatigue.     Current medicines are reviewed at length with the patient today.  The patient does not have concerns regarding medicines.  The following changes have been made: None  Labs/ tests ordered today include:   Orders Placed This Encounter  Procedures  . EKG 12-Lead     Disposition:   FU with me in 6 months  Signed, Rollene Rotunda, MD  12/19/2020 10:31 AM    [facility]

## 2020-12-19 ENCOUNTER — Encounter: Payer: Self-pay | Admitting: Cardiology

## 2020-12-19 ENCOUNTER — Other Ambulatory Visit: Payer: Self-pay

## 2020-12-19 ENCOUNTER — Ambulatory Visit (INDEPENDENT_AMBULATORY_CARE_PROVIDER_SITE_OTHER): Payer: No Typology Code available for payment source | Admitting: Cardiology

## 2020-12-19 VITALS — BP 120/88 | HR 63 | Ht 69.0 in | Wt 235.0 lb

## 2020-12-19 DIAGNOSIS — E663 Overweight: Secondary | ICD-10-CM | POA: Diagnosis not present

## 2020-12-19 DIAGNOSIS — I2510 Atherosclerotic heart disease of native coronary artery without angina pectoris: Secondary | ICD-10-CM | POA: Diagnosis not present

## 2020-12-19 DIAGNOSIS — I10 Essential (primary) hypertension: Secondary | ICD-10-CM

## 2020-12-19 DIAGNOSIS — E785 Hyperlipidemia, unspecified: Secondary | ICD-10-CM

## 2020-12-19 DIAGNOSIS — I77810 Thoracic aortic ectasia: Secondary | ICD-10-CM | POA: Diagnosis not present

## 2020-12-19 NOTE — Patient Instructions (Signed)
Medication Instructions:   No changes  *If you need a refill on your cardiac medications before your next appointment, please call your pharmacy*   Lab Work:  Not needed  Testing/Procedures:  reschedule  Exercise tolerance test to May or early June 2022  From July 2022 Will be schedule at [address]  Your physician has requested that you have an exercise tolerance test. For further information please visit [email or web address] Please also follow instruction sheet, as given.    Follow-Up: At [facility], you and your health needs are our priority.  As part of our continuing mission to provide you with exceptional heart care, we have created designated Provider Care Teams.  These Care Teams include your primary Cardiologist (physician) and Advanced Practice Providers (APPs -  Physician Assistants and Nurse Practitioners) who all work together to provide you with the care you need, when you need it.     Your next appointment:   6 month(s)  The format for your next appointment:   In Person  Provider:   Rollene Rotunda, MD  Other instructions   Your physician recommends that you schedule a follow-up appointment about his CPAP  Your physician discussed the importance of regular exercise and recommended that you start or continue a regular exercise program for good health.

## 2020-12-20 NOTE — Addendum Note (Signed)
Addended by: Bea Laura B on: 12/20/2020 12:26 PM   Modules accepted: Orders

## 2020-12-22 ENCOUNTER — Telehealth (HOSPITAL_COMMUNITY): Payer: Self-pay | Admitting: *Deleted

## 2020-12-22 NOTE — Telephone Encounter (Signed)
Close encounter 

## 2020-12-23 ENCOUNTER — Ambulatory Visit (HOSPITAL_COMMUNITY)
Admission: RE | Admit: 2020-12-23 | Discharge: 2020-12-23 | Disposition: A | Discharge status: Home / Self Care | Payer: No Typology Code available for payment source | Source: Ambulatory Visit | Attending: Cardiovascular Disease | Admitting: Cardiovascular Disease

## 2020-12-23 ENCOUNTER — Other Ambulatory Visit: Payer: Self-pay

## 2020-12-23 DIAGNOSIS — I2510 Atherosclerotic heart disease of native coronary artery without angina pectoris: Secondary | ICD-10-CM | POA: Diagnosis present

## 2020-12-23 LAB — EXERCISE TOLERANCE TEST
Estimated workload: 11.2 METS
Exercise duration (min): 9 min
Exercise duration (sec): 44 s
MPHR: 169 {beats}/min
Peak HR: 139 {beats}/min
Percent HR: 82 %
Rest HR: 85 {beats}/min

## 2020-12-27 ENCOUNTER — Encounter: Payer: Self-pay | Admitting: Neurology

## 2020-12-27 NOTE — Addendum Note (Signed)
Addended by: Rollene Rotunda on: 12/27/2020 07:36 AM   Modules accepted: Orders

## 2020-12-28 ENCOUNTER — Other Ambulatory Visit: Payer: Self-pay

## 2020-12-28 ENCOUNTER — Ambulatory Visit (INDEPENDENT_AMBULATORY_CARE_PROVIDER_SITE_OTHER): Payer: No Typology Code available for payment source | Admitting: Neurology

## 2020-12-28 ENCOUNTER — Encounter: Payer: Self-pay | Admitting: Neurology

## 2020-12-28 VITALS — BP 128/86 | HR 68 | Ht 69.0 in | Wt 231.0 lb

## 2020-12-28 DIAGNOSIS — Z9989 Dependence on other enabling machines and devices: Secondary | ICD-10-CM

## 2020-12-28 DIAGNOSIS — R931 Abnormal findings on diagnostic imaging of heart and coronary circulation: Secondary | ICD-10-CM | POA: Diagnosis not present

## 2020-12-28 DIAGNOSIS — E119 Type 2 diabetes mellitus without complications: Secondary | ICD-10-CM

## 2020-12-28 DIAGNOSIS — G4726 Circadian rhythm sleep disorder, shift work type: Secondary | ICD-10-CM

## 2020-12-28 DIAGNOSIS — G4733 Obstructive sleep apnea (adult) (pediatric): Secondary | ICD-10-CM | POA: Diagnosis not present

## 2020-12-28 NOTE — Patient Instructions (Signed)

## 2020-12-28 NOTE — Progress Notes (Signed)
SLEEP MEDICINE CLINIC   Provider:  Melvyn Novas, M D  Primary Care Physician:  Adrian Prince, MD   Referring Provider: Adrian Prince, MD    Chief Complaint  Patient presents with  . Follow-up    Pt alone, rm 10. Presents today for yearly f/u for CPAP. He recently saw his cardiologist and was discussing how he has had increase in excessive daytime sleepiness/fatigue. Here lately states he may go to bed 11p to 7:30 am and he struggles to wake up in the morning.     12-28-2020: C. Lansing Sigmon, MD   Marcus Dickson is a 50 y.o. 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 Evlyn Kanner for a sleep apnea evaluation.  Just passed his  DOT physical. DM 2, HTN, Obesity at BMI of 34.11.  Has been using CPAP at 70% compliance, has not taken it on recent travels.    He underwent a sleep study by HST 04/2018 : Total Recording Time: 7 hours 55 minutes, Valid 7  h and 44 min.  Total Apnea/Hypopnea Index (AHI): 36.7 /h; RDI: 38.3 /h  Average Oxygen Saturation: 94 %; Lowest Oxygen Saturation: 73 %  Total Time in Oxygen Saturation below 89 %: 38.0 minutes  Average Heart Rate: 63 bpm (between 54 and 81 bpm).   IMPRESSION: Severe Complex sleep apnea by apnea Link- 64%  obstructive and 43% central apneas. Loud snoring. Oxygen  desaturation at 36.8/h followed AHI.  Hypoxemia duration of almost 10% of recorded time.   He endorsed today's fatigue severity scale at 24 points which is not too high and the Epworth sleepiness score at 2 or 3 out of 24 points.  His compliance record as I stated was 70% to use the machine 21 out of 30 days and in each of those days over 4 hours the average user time on days used is 7 hours 23 minutes.  His AutoSet CPAP is providing a minimum pressure of 6 and a maximum pressure of 16 cmH2O with 3 cm EPR his residual AHI is 1.0/h which speaks for an excellent resolution and control of apnea.  He does have  moderate high air leakage from his interface.  The 95th percentile pressure is 11.4 and well covered with current settings.  There were no Cheyne-Stokes respirations arising.   12-28-2020:  Based on his download I do not think that apnea has anything to do with his current feeling of increased fatigue and he seems not to call related to his COVID infection as a past viral illness either. He feels sometimes the air is too hot or too cold.    For the most part he feels that CPAP has increased his alertness and daytime however recently there is a different component of more fatigue and sleepiness. It started about 3 month ago - he had COVID in January , had an Ab infusion. His symptoms subsided quickly. Temporarily SOB.   He has spoken to Dr. Antoine Poche about increased fatigue. Had Moderna vaccine.   His cardiologist however has not found concern in his part of the examination.  As far as I can see there were no questions by Dr. Evlyn Kanner directed towards me about any kind of medication changes, recent new diagnoses or other medical developments that may have led to higher degrees of sleepiness.         INITIAL CONSULT:   Sleep habits are as follows: irregular work  hours determine irregular sleep times.  He may work between 4 AM and until 10 PM, and he travels a lot with an Administrator, sports.  Mr. Pucillo is not primarily a driver but his company requires a driver for each work crew.  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   Whenever he can retreat to the bedroom he is asleep in 15 seconds and his wife reports he snores soon after - but not continuously, his wife tries to get him off his back. He can't sleep very long on his sides as his shoulders hurt. The bedroom is dark, cool at 68 F, quiet. He always wakes up at 7.30 AM while on the road or at home. - no matter when he went to bed. Even when not on the road he gets phone calls, but works mostly day shift.  He  doesn't wake choking -   Average sleep duration at home 7 hours, on the road - anything possible. He wakes up congested and with a parched mouth, has a lot mucous.    Sleep medical history and family sleep history:  Snoring for several years , as he gained weight - he has lost 30 pounds since 2018 and is able to keep it off.  Retrognathia.  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 Non smoker, ETOH- seldomly .  Caffeine : 2 cups a day of coffee, shift worker - swing shifts.    Review of Systems: Out of a complete 14 system review, the patient complains of only the following symptoms, and all other reviewed systems are negative.  Epworth score 3/ 24  , Fatigue severity score 16/ 63   , depression score-    Social History   Socioeconomic History  . Marital status: Married    Spouse name: Not on file  . Number of children: 2  . Years of education: Not on file  . Highest education level: Not on file  Occupational History  . Occupation: Teaching laboratory technician: [employer]  Tobacco Use  . Smoking status: Never Smoker  . Smokeless tobacco: Never Used  Vaping Use  . Vaping Use: Never used  Substance and Sexual Activity  . Alcohol use: No  . Drug use: No  . Sexual activity: Yes  Other Topics Concern  . Not on file  Social History Narrative  . Not on file   Social Determinants of Health   Financial Resource Strain: Not on file  Food Insecurity: Not on file  Transportation Needs: Not on file  Physical Activity: Not on file  Stress: Not on file  Social Connections: Not on file  Intimate Partner Violence: Not on file    Family History  Problem Relation Age of Onset  . CAD Father 62       Died MI - was a smoker  . Heart attack Father   . Stroke Father   . CAD Mother 71       CABG; post op cardiac arrest during knee surgery   . Heart attack  Mother   . Stroke Paternal Grandmother     Past Medical History:  Diagnosis Date  . Diabetes (HCC)   . Dyslipidemia    Low HDL  . High coronary artery calcium score   . Hypertension   . Overweight   . Sleep  apnea     Past Surgical History:  Procedure Laterality Date  . FINGER SURGERY    . VASECTOMY      Current Outpatient Medications  Medication Sig Dispense Refill  . Alirocumab (PRALUENT) 150 MG/ML SOAJ Inject 150 mg/mL into the skin every 14 (fourteen) days. Inject 150 mg/ml into the skin every 2 weeks.    Marland Kitchen. aspirin 81 MG tablet Take 81 mg by mouth daily.    Marland Kitchen. atenolol (TENORMIN) 50 MG tablet TAKE 1 TABLET BY MOUTH EVERY MORNING AND 2 TABLETS EVERY EVENING 270 tablet 3  . benzonatate (TESSALON) 100 MG capsule TAKE 1 CAPSULE BY MOUTH THREE TIMES DAILY AS NEEDED 90 capsule 1  . cyclobenzaprine (FLEXERIL) 10 MG tablet TAKE 1 TABLET BY MOUTH TWICE DAILY AS NEEDED 60 tablet 1  . Empagliflozin-metFORMIN HCl ER 25-1000 MG TB24 Take 1 tablet by mouth daily.    Marland Kitchen. glucose blood test strip USE TO TEST SUGAR FOUR TIMES DAILY. (Patient taking differently: USE TO TEST SUGAR FOUR TIMES DAILY.) 400 strip 3  . icosapent Ethyl (VASCEPA) 1 g capsule TAKE 2 CAPSULE BY MOUTH TWICE DAILY. 360 capsule 3  . losartan (COZAAR) 50 MG tablet TAKE 1 TABLET (50 MG TOTAL) BY MOUTH DAILY. 90 tablet 3  . meloxicam (MOBIC) 7.5 MG tablet Take 7.5 mg by mouth as needed.     No current facility-administered medications for this visit.    Allergies as of 12/28/2020  . (No Known Allergies)    Vitals: BP 128/86   Pulse 68   Ht 5\' 9"  (1.753 m)   Wt 231 lb (104.8 kg)   BMI 34.11 kg/m  Last Weight:  Wt Readings from Last 1 Encounters:  12/28/20 231 lb (104.8 kg)   ZOX:WRUEBMI:Body mass index is 34.11 kg/m.     Last Height:   Ht Readings from Last 1 Encounters:  12/28/20 5\' 9"  (1.753 m)    Physical exam:  General: The patient is awake, alert and appears not in acute distress. The patient is well  groomed. Head: Normocephalic, atraumatic. Neck is supple. Mallampati 3 ,  neck circumference:20  Nasal airflow patent , Retrognathia is seen.  Cardiovascular:  Regular rate and rhythm , without  murmurs or carotid bruit, and without distended neck veins. Respiratory: Lungs are clear to auscultation. Skin:  Without evidence of edema, or rash Trunk: BMI is 36.6 . The patient's posture is erect.   Neurologic exam : The patient is awake and alert, oriented to place and time.   Memory subjective described as intact.   Attention span & concentration ability appears normal.  Speech is fluent,  without  dysarthria, dysphonia or aphasia.  Mood and affect are appropriate.  Cranial nerves: Pupils are equal and briskly reactive to light. Visual fields by finger perimetry are intact. Hearing to finger rub intact. Facial sensation intact to fine touch. Facial motor strength is symmetric and tongue and uvula move midline. Shoulder shrug was symmetrical.  Motor exam:   Normal tone, muscle bulk and symmetric strength in all extremities. Sensory:  Fine touch, pinprick and vibration normal. Deep tendon reflexes: in the upper and lower extremities are symmetric and intact.    Assessment:  After physical and neurologic examination, review of laboratory studies,  Personal review of imaging studies, reports of other /same  Imaging studies, results of polysomnography and / or neurophysiology testing and pre-existing records as far as provided in visit., my assessment is ;  1) Severe OSA by apnea link .  1) Mr. Jakel Alphin is a 50 year old Caucasian right-handed gentleman who presents today for Just passed his  DOT physical. DM 2, HTN, Obesity at BMI of 34.11.  The patient was advised of the nature of the diagnosed disorder , the treatment options and the  risks for general health and wellness arising from not treating the condition.   I spent more than 22  minutes of face to face time with the patient.  Greater than 50% of time was spent in counseling and coordination of care. We have discussed the diagnosis and differential and I answered the patient's questions.    Plan:  Treatment plan and additional workup : Asking DME to help with a non-heated hose for the warmer part of the year, avoiding hot air.   Rv yearly, next year - he is due for a new machine in 2024 .   Melvyn Novas, MD 12/28/2020, 2:37 PM  Certified in Neurology by ABPN Certified in Sleep Medicine by [facility] [address] [place], [place] [address]

## 2021-01-13 ENCOUNTER — Other Ambulatory Visit: Payer: Self-pay | Admitting: Cardiology

## 2021-01-13 ENCOUNTER — Other Ambulatory Visit (HOSPITAL_COMMUNITY): Payer: Self-pay

## 2021-01-13 DIAGNOSIS — I10 Essential (primary) hypertension: Secondary | ICD-10-CM

## 2021-01-13 MED ORDER — CARESTART COVID-19 HOME TEST VI KIT
PACK | 0 refills | Status: DC
  Filled 2021-01-13: qty 4, 4d supply, fill #0

## 2021-01-13 MED ORDER — LOSARTAN POTASSIUM 50 MG PO TABS
50.0000 mg | ORAL_TABLET | Freq: Every day | ORAL | 3 refills | Status: DC
  Filled 2021-01-13 – 2021-02-10 (×3): qty 90, 90d supply, fill #0
  Filled 2021-05-26: qty 90, 90d supply, fill #1
  Filled 2021-08-18: qty 90, 90d supply, fill #2
  Filled 2021-11-16: qty 90, 90d supply, fill #3

## 2021-01-13 MED FILL — Atenolol Tab 50 MG: ORAL | 90 days supply | Qty: 270 | Fill #0 | Status: AC

## 2021-01-31 ENCOUNTER — Other Ambulatory Visit (HOSPITAL_COMMUNITY): Payer: Self-pay

## 2021-02-01 ENCOUNTER — Other Ambulatory Visit (HOSPITAL_COMMUNITY): Payer: Self-pay

## 2021-02-02 ENCOUNTER — Other Ambulatory Visit: Payer: Self-pay | Admitting: *Deleted

## 2021-02-02 DIAGNOSIS — I712 Thoracic aortic aneurysm, without rupture, unspecified: Secondary | ICD-10-CM

## 2021-02-03 ENCOUNTER — Other Ambulatory Visit (HOSPITAL_COMMUNITY): Payer: Self-pay

## 2021-02-08 ENCOUNTER — Other Ambulatory Visit (HOSPITAL_COMMUNITY): Payer: Self-pay

## 2021-02-09 ENCOUNTER — Encounter (HOSPITAL_COMMUNITY): Payer: No Typology Code available for payment source

## 2021-02-09 ENCOUNTER — Other Ambulatory Visit (HOSPITAL_COMMUNITY): Payer: Self-pay

## 2021-02-10 ENCOUNTER — Other Ambulatory Visit (HOSPITAL_COMMUNITY): Payer: Self-pay

## 2021-02-10 MED ORDER — SYNJARDY XR 25-1000 MG PO TB24
1.0000 | ORAL_TABLET | Freq: Every day | ORAL | 3 refills | Status: DC
  Filled 2021-02-10: qty 90, 90d supply, fill #0
  Filled 2021-05-26: qty 90, 90d supply, fill #1
  Filled 2021-08-18: qty 90, 90d supply, fill #2
  Filled 2021-11-16: qty 90, 90d supply, fill #3

## 2021-02-13 ENCOUNTER — Other Ambulatory Visit (HOSPITAL_COMMUNITY): Payer: Self-pay

## 2021-02-27 ENCOUNTER — Other Ambulatory Visit (HOSPITAL_COMMUNITY): Payer: Self-pay

## 2021-02-27 MED FILL — Icosapent Ethyl Cap 1 GM: ORAL | 90 days supply | Qty: 360 | Fill #1 | Status: AC

## 2021-02-28 ENCOUNTER — Other Ambulatory Visit (HOSPITAL_COMMUNITY): Payer: Self-pay

## 2021-03-01 ENCOUNTER — Other Ambulatory Visit (HOSPITAL_COMMUNITY): Payer: Self-pay

## 2021-03-02 ENCOUNTER — Other Ambulatory Visit (HOSPITAL_COMMUNITY): Payer: Self-pay

## 2021-03-02 MED ORDER — PRALUENT 150 MG/ML ~~LOC~~ SOAJ
SUBCUTANEOUS | 2 refills | Status: DC
  Filled 2021-03-02: qty 6, 84d supply, fill #0
  Filled 2021-05-26: qty 6, 84d supply, fill #1
  Filled 2021-08-18: qty 6, 84d supply, fill #2

## 2021-03-03 ENCOUNTER — Other Ambulatory Visit (HOSPITAL_COMMUNITY): Payer: Self-pay

## 2021-03-12 NOTE — Progress Notes (Deleted)
Cardiology Office Note   Date:  03/12/2021   ID:  Marcus Dickson, DOB 14-Jan-1971, MRN [id number]  PCP:  Reynold Bowen, MD  Cardiologist:   Minus Breeding, MD   No chief complaint on file.     History of Present Illness: Marcus Dickson is a 50 y.o. male who presents for a history of coronary calcium.  We found this on screening because he has a very strong family history of early onset coronary artery disease. He had a negative POET (Plain Old Exercise Treadmill) last in Feb of 2019.  He had dyspnea.  I sent him for coronary CTA.  He had non obstructive disease as below.  He does have a mildly enlarged aortic root.     He had a follow up negative POET (Plain Old Exercise Treadmill).    Since I last sat him ***  ***   he has had some increased fatigue.  He says he is doing activities like going waxing his car and thinks he just has less exercise tolerance.  He is noting at times that his heart rate goes up faster when he does activities like this and he would think.  His blood pressure might be little higher in the 130/95 range.  He sometimes feels like he has to yawn or that he is a little more winded than he thinks he should be.  He is not describing any chest pressure, neck or arm discomfort.  He is not having any new palpitations, presyncope or syncope.  Past Medical History:  Diagnosis Date   Diabetes ([place])    Dyslipidemia    Low HDL   High coronary artery calcium score    Hypertension    Overweight    Sleep apnea     Past Surgical History:  Procedure Laterality Date   FINGER SURGERY     VASECTOMY       Current Outpatient Medications  Medication Sig Dispense Refill   Alirocumab (PRALUENT) 150 MG/ML SOAJ Inject 150 mg/mL into the skin every 14 (fourteen) days. Inject 150 mg/ml into the skin every 2 weeks.     Alirocumab (PRALUENT) 150 MG/ML SOAJ INJECT 150 MG UNDER THE SKIN EVERY 2 WEEKS 6 mL 2   aspirin 81 MG tablet Take 81 mg by mouth daily.     atenolol  (TENORMIN) 50 MG tablet TAKE 1 TABLET BY MOUTH EVERY MORNING AND 2 TABLETS EVERY EVENING 270 tablet 3   benzonatate (TESSALON) 100 MG capsule TAKE 1 CAPSULE BY MOUTH THREE TIMES DAILY AS NEEDED 90 capsule 1   COVID-19 At Home Antigen Test (CARESTART COVID-19 HOME TEST) KIT use as directed 4 each 0   cyclobenzaprine (FLEXERIL) 10 MG tablet TAKE 1 TABLET BY MOUTH TWICE DAILY AS NEEDED 60 tablet 1   Empagliflozin-metFORMIN HCl ER (SYNJARDY XR) 25-1000 MG TB24 Take 1 tablet by mouth daily. 90 tablet 3   Empagliflozin-metFORMIN HCl ER 25-1000 MG TB24 Take 1 tablet by mouth daily.     glucose blood test strip USE TO TEST SUGAR FOUR TIMES DAILY. (Patient taking differently: USE TO TEST SUGAR FOUR TIMES DAILY.) 400 strip 3   icosapent Ethyl (VASCEPA) 1 g capsule TAKE 2 CAPSULE BY MOUTH TWICE DAILY. 360 capsule 3   losartan (COZAAR) 50 MG tablet Take 1 tablet (50 mg total) by mouth daily. 90 tablet 3   meloxicam (MOBIC) 7.5 MG tablet Take 7.5 mg by mouth as needed.     No current facility-administered medications for this visit.  Allergies:   Patient has no known allergies.   ROS:  Please see the history of present illness.   Otherwise, review of systems are positive for ***.   All other systems are reviewed and negative.    PHYSICAL EXAM: VS:  There were no vitals taken for this visit. , BMI There is no height or weight on file to calculate BMI. GENERAL:  Well appearing NECK:  No jugular venous distention, waveform within normal limits, carotid upstroke brisk and symmetric, no bruits, no thyromegaly LUNGS:  Clear to auscultation bilaterally CHEST:  Unremarkable HEART:  PMI not displaced or sustained,S1 and S2 within normal limits, no S3, no S4, no clicks, no rubs, *** murmurs ABD:  Flat, positive bowel sounds normal in frequency in pitch, no bruits, no rebound, no guarding, no midline pulsatile mass, no hepatomegaly, no splenomegaly EXT:  2 plus pulses throughout, no edema, no cyanosis no  clubbing    ***  GENERAL:  Well appearing NECK:  No jugular venous distention, waveform within normal limits, carotid upstroke brisk and symmetric, no bruits, no thyromegaly LUNGS:  Clear to auscultation bilaterally CHEST:  Unremarkable HEART:  PMI not displaced or sustained,S1 and S2 within normal limits, no S3, no S4, no clicks, no rubs, no murmurs ABD:  Flat, positive bowel sounds normal in frequency in pitch, no bruits, no rebound, no guarding, no midline pulsatile mass, no hepatomegaly, no splenomegaly EXT:  2 plus pulses throughout, no edema, no cyanosis no clubbing   CT:    Coronary Arteries:  Normal coronary origin.  Left dominance.   RCA is a small non-dominant artery giving rise to a small RV branch. There is minimal calcification in the mid RCA causing minimal non obstructive CAD (0-24%)   Left main is a large artery that gives rise to LAD and LCX arteries.   LAD is a large vessel that has heavily calcified plaque in the proximal and mid segments causing at least moderate stenosis (50-69%). Challenging to rule out severe stenosis due to significant calcification causing calcium/blooming artifacts.   LCX is a dominant artery that gives rise 3 obtuse marginal branches prior to suppling the PDA and PLA. There is severe calcified plaque in the mid and distal LCx (prior to PDA takeoff), causing moderate stenosis (50-69%).   EKG:  EKG is *** ordered today. The ekg ordered today demonstrates sinus rhythm, rate ***, right axis deviation, poor anterior R wave progression, no acute ST-T wave changes.   Recent Labs: No results found for requested labs within last 8760 hours.    Lipid Panel    Component Value Date/Time   CHOL 127 09/08/2018 0944   TRIG 189 (H) 09/08/2018 0944   HDL 36 (L) 09/08/2018 0944   CHOLHDL 3.5 09/08/2018 0944   CHOLHDL 3.8 07/24/2016 0923   VLDL 26 07/24/2016 0923   LDLCALC 53 09/08/2018 0944      Wt Readings from Last 3 Encounters:   12/28/20 231 lb (104.8 kg)  12/19/20 235 lb (106.6 kg)  02/12/20 240 lb 3.2 oz (109 kg)      Other studies Reviewed: Additional studies/ records that were reviewed today include: *** Review of the above records demonstrates:  Please see elsewhere in the note.     ASSESSMENT AND PLAN:   CAD:   *** This was non obstructive however, he has some vague symptoms.  I will bring the patient back for a POET (Plain Old Exercise Test). This will allow me to screen for obstructive coronary disease,  risk stratify and very importantly provide a prescription for exercise.  I had a long discussion with the patient and his wife and explained that the most important thing to follow her symptoms to look for high risk findings on stress testing and that the best intervention is risk reduction.  Toward that end we had a long discussion about diet and exercise again.  ENLARGED AORTA:   This was 4 cm in April 2021.  ***  I will follow this next year with a contrasted CT.  OVERWEIGHT:    ***  I applauded his weight loss to date and encouraged more of the same.   DM:   A1c was ***  6.6 and followed by Dr. Forde Dandy.   DYSLIPIDEMIA:      His LDL *** 26 on PCSK9 inhibitor.   SLEEP APNEA:    ***   He has increased fatigue and I have asked him to get an appointment with Dr. Brett Fairy to follow-up his CPAP as it has been a while.  FATIGUE:  ***  I asked him to check with Dr. Doree Albee to make sure he had a recent TSH and CBC.  Otherwise as above.Marland Kitchen     HTN:   The blood pressure is *** slightly elevated diastolic.  I would like to see continued weight loss to bring him to target as I do not want we have further meds possibly contributing to his fatigue.     Current medicines are reviewed at length with the patient today.  The patient does not have concerns regarding medicines.  The following changes have been made: ***  Labs/ tests ordered today include: ***  No orders of the defined types were placed in this  encounter.    Disposition:   FU with me in *** months  Signed, Minus Breeding, MD  03/12/2021 12:03 PM    [place]

## 2021-03-13 ENCOUNTER — Ambulatory Visit: Payer: No Typology Code available for payment source | Admitting: Cardiology

## 2021-03-13 DIAGNOSIS — I10 Essential (primary) hypertension: Secondary | ICD-10-CM

## 2021-03-13 DIAGNOSIS — I2510 Atherosclerotic heart disease of native coronary artery without angina pectoris: Secondary | ICD-10-CM

## 2021-03-13 DIAGNOSIS — I77810 Thoracic aortic ectasia: Secondary | ICD-10-CM

## 2021-03-16 NOTE — Progress Notes (Signed)
Cardiology Office Note   Date:  03/17/2021   ID:  Marcus Dickson, DOB 1971-03-04, MRN [id number]  PCP:  Reynold Bowen, MD  Cardiologist:   Minus Breeding, MD   Chief Complaint  Patient presents with   Shortness of Breath       History of Present Illness: Marcus Dickson is a 50 y.o. male who presents for a history of coronary calcium.  We found this on screening because he has a very strong family history of early onset coronary artery disease. He had a negative POET (Plain Old Exercise Treadmill) last in Feb of 2019.  He had dyspnea.  I sent him for coronary CTA.  He had non obstructive disease as below.  He does have a mildly enlarged aortic root.     He had a follow up negative POET (Plain Old Exercise Treadmill).  However, he did only get his heart rate to 82% of predicted was limited somewhat by shortness of breath.  He comes back to discuss this.  Since I last sat him he has reduced his stress.  He started doing some exercising.  He is trying to eat right.  He has had his lipids well managed.  His cholesterol is managed.  Blood pressure seems to be controlled.  He is not having any new chest pressure, neck or arm discomfort.  He is not having any new shortness of breath, PND or orthopnea.  As mentioned he says he does have trouble getting his heart rate elevated with activity.   Past Medical History:  Diagnosis Date   Diabetes ([place])    Dyslipidemia    Low HDL   High coronary artery calcium score    Hypertension    Overweight    Sleep apnea     Past Surgical History:  Procedure Laterality Date   FINGER SURGERY     VASECTOMY       Current Outpatient Medications  Medication Sig Dispense Refill   Alirocumab (PRALUENT) 150 MG/ML SOAJ Inject 150 mg/mL into the skin every 14 (fourteen) days. Inject 150 mg/ml into the skin every 2 weeks.     Alirocumab (PRALUENT) 150 MG/ML SOAJ INJECT 150 MG UNDER THE SKIN EVERY 2 WEEKS 6 mL 2   aspirin 81 MG tablet Take 81 mg by mouth  daily.     atenolol (TENORMIN) 50 MG tablet TAKE 1 TABLET BY MOUTH EVERY MORNING AND 2 TABLETS EVERY EVENING 270 tablet 3   benzonatate (TESSALON) 100 MG capsule TAKE 1 CAPSULE BY MOUTH THREE TIMES DAILY AS NEEDED 90 capsule 1   COVID-19 At Home Antigen Test (CARESTART COVID-19 HOME TEST) KIT use as directed 4 each 0   cyclobenzaprine (FLEXERIL) 10 MG tablet TAKE 1 TABLET BY MOUTH TWICE DAILY AS NEEDED 60 tablet 1   Empagliflozin-metFORMIN HCl ER (SYNJARDY XR) 25-1000 MG TB24 Take 1 tablet by mouth daily. 90 tablet 3   glucose blood test strip USE TO TEST SUGAR FOUR TIMES DAILY. (Patient taking differently: USE TO TEST SUGAR FOUR TIMES DAILY.) 400 strip 3   icosapent Ethyl (VASCEPA) 1 g capsule TAKE 2 CAPSULE BY MOUTH TWICE DAILY. 360 capsule 3   losartan (COZAAR) 50 MG tablet Take 1 tablet (50 mg total) by mouth daily. 90 tablet 3   meloxicam (MOBIC) 7.5 MG tablet Take 7.5 mg by mouth as needed.     No current facility-administered medications for this visit.    Allergies:   Patient has no known allergies.   ROS:  Please see the history of present illness.   Otherwise, review of systems are positive for none.   All other systems are reviewed and negative.    PHYSICAL EXAM: VS:  BP 122/83   Pulse 69   Ht '5\' 9"'  (1.753 m)   Wt 233 lb 6.4 oz (105.9 kg)   SpO2 97%   BMI 34.47 kg/m  , BMI Body mass index is 34.47 kg/m. GENERAL:  Well appearing NECK:  No jugular venous distention, waveform within normal limits, carotid upstroke brisk and symmetric, no bruits, no thyromegaly LUNGS:  Clear to auscultation bilaterally CHEST:  Unremarkable HEART:  PMI not displaced or sustained,S1 and S2 within normal limits, no S3, no S4, no clicks, no rubs, no murmurs ABD:  Flat, positive bowel sounds normal in frequency in pitch, no bruits, no rebound, no guarding, no midline pulsatile mass, no hepatomegaly, no splenomegaly EXT:  2 plus pulses throughout, no edema, no cyanosis no clubbing  CT:     Coronary Arteries:  Normal coronary origin.  Left dominance.   RCA is a small non-dominant artery giving rise to a small RV branch. There is minimal calcification in the mid RCA causing minimal non obstructive CAD (0-24%)   Left main is a large artery that gives rise to LAD and LCX arteries.   LAD is a large vessel that has heavily calcified plaque in the proximal and mid segments causing at least moderate stenosis (50-69%). Challenging to rule out severe stenosis due to significant calcification causing calcium/blooming artifacts.   LCX is a dominant artery that gives rise 3 obtuse marginal branches prior to suppling the PDA and PLA. There is severe calcified plaque in the mid and distal LCx (prior to PDA takeoff), causing moderate stenosis (50-69%).   EKG:  EKG is not ordered today.   Recent Labs: No results found for requested labs within last 8760 hours.    Lipid Panel    Component Value Date/Time   CHOL 127 09/08/2018 0944   TRIG 189 (H) 09/08/2018 0944   HDL 36 (L) 09/08/2018 0944   CHOLHDL 3.5 09/08/2018 0944   CHOLHDL 3.8 07/24/2016 0923   VLDL 26 07/24/2016 0923   LDLCALC 53 09/08/2018 0944      Wt Readings from Last 3 Encounters:  03/17/21 233 lb 6.4 oz (105.9 kg)  12/28/20 231 lb (104.8 kg)  12/19/20 235 lb (106.6 kg)      Other studies Reviewed: Additional studies/ records that were reviewed today include: POET (Plain Old Exercise Treadmill) Review of the above records demonstrates:  Please see elsewhere in the note.     ASSESSMENT AND PLAN:   CAD:   We had another long discussion.  He does have some shortness of breath.   He had a submaximal POET (Plain Old Exercise Treadmill).  Given this I think a different functional study is reasonable and I think I would like a dobutamine echocardiogram.  He questioned having another CTA but I had a long discussion with him about the need to demonstrate functional hemodynamic significance of these lesions  and evaluated looking for high risk findings on stress test in a patient without clear symptoms.   Number we again had a very long discussion about the benefits of optimal medical therapy with lifestyle changes and I think he is slowly come around to this.   ENLARGED AORTA:   This was 4 cm in April 2021.  I will consider follow-up visit after I see him again in 6 months.  OVERWEIGHT:    I have applauded his weight loss I applauded his weight loss and I encouraged more of the same.   DM:   A1c was 6.6 and followed by Dr. Forde Dandy.  DYSLIPIDEMIA:      His LDL 26 with an HDL of 48.  He will continue the meds as listed.   SLEEP APNEA:    He is now following with Dr. Brett Fairy and he is found to be compliant with his CPAP and I reviewed her notes.  HTN:   The blood pressure is well controlled.  No change in therapy.    Current medicines are reviewed at length with the patient today.  The patient does not have concerns regarding medicines.  The following changes have been made: None  Labs/ tests ordered today include:   Orders Placed This Encounter  Procedures   ECHOCARDIOGRAM STRESS TEST      Disposition:   FU with me in 6 months  Signed, Minus Breeding, MD  03/17/2021 1:27 PM    [facility]

## 2021-03-17 ENCOUNTER — Encounter: Payer: Self-pay | Admitting: Cardiology

## 2021-03-17 ENCOUNTER — Ambulatory Visit (INDEPENDENT_AMBULATORY_CARE_PROVIDER_SITE_OTHER): Payer: No Typology Code available for payment source | Admitting: Cardiology

## 2021-03-17 ENCOUNTER — Other Ambulatory Visit: Payer: Self-pay

## 2021-03-17 VITALS — BP 122/83 | HR 69 | Ht 69.0 in | Wt 233.4 lb

## 2021-03-17 DIAGNOSIS — E785 Hyperlipidemia, unspecified: Secondary | ICD-10-CM | POA: Diagnosis not present

## 2021-03-17 DIAGNOSIS — I10 Essential (primary) hypertension: Secondary | ICD-10-CM | POA: Diagnosis not present

## 2021-03-17 DIAGNOSIS — I2510 Atherosclerotic heart disease of native coronary artery without angina pectoris: Secondary | ICD-10-CM

## 2021-03-17 DIAGNOSIS — I77810 Thoracic aortic ectasia: Secondary | ICD-10-CM

## 2021-03-17 DIAGNOSIS — Q2579 Other congenital malformations of pulmonary artery: Secondary | ICD-10-CM

## 2021-03-17 DIAGNOSIS — R0602 Shortness of breath: Secondary | ICD-10-CM

## 2021-03-17 NOTE — Patient Instructions (Signed)
  Testing/Procedures:  Your physician has requested that you have a stress echocardiogram. For further information please visit [email or web address] Please follow instruction sheet as given. [address]   Follow-Up: At [facility], you and your health needs are our priority.  As part of our continuing mission to provide you with exceptional heart care, we have created designated Provider Care Teams.  These Care Teams include your primary Cardiologist (physician) and Advanced Practice Providers (APPs -  Physician Assistants and Nurse Practitioners) who all work together to provide you with the care you need, when you need it.  We recommend signing up for the patient portal called "MyChart".  Sign up information is provided on this After Visit Summary.  MyChart is used to connect with patients for Virtual Visits (Telemedicine).  Patients are able to view lab/test results, encounter notes, upcoming appointments, etc.  Non-urgent messages can be sent to your provider as well.   To learn more about what you can do with MyChart, go to [email or web address].    Your next appointment:   6 month(s)  The format for your next appointment:   In Person  Provider:   Rollene Rotunda, MD

## 2021-04-25 ENCOUNTER — Telehealth (HOSPITAL_COMMUNITY): Payer: Self-pay | Admitting: *Deleted

## 2021-04-25 NOTE — Telephone Encounter (Signed)
Left message on voicemail per DPR in reference to upcoming appointment scheduled on 05/01/21 at 2:00 with detailed instructions given per Myocardial Perfusion Study Information Sheet for the test. LM to arrive 15 minutes early, and that it is imperative to arrive on time for appointment to keep from having the test rescheduled. If you need to cancel or reschedule your appointment, please call the office within 24 hours of your appointment. Failure to do so may result in a cancellation of your appointment, and a $50 no show fee. Phone number given for call back for any questions.

## 2021-05-01 ENCOUNTER — Ambulatory Visit (HOSPITAL_COMMUNITY): Payer: No Typology Code available for payment source | Attending: Cardiovascular Disease

## 2021-05-01 ENCOUNTER — Ambulatory Visit (HOSPITAL_COMMUNITY): Payer: No Typology Code available for payment source

## 2021-05-01 ENCOUNTER — Other Ambulatory Visit: Payer: Self-pay

## 2021-05-01 DIAGNOSIS — Q2579 Other congenital malformations of pulmonary artery: Secondary | ICD-10-CM | POA: Diagnosis not present

## 2021-05-01 DIAGNOSIS — R0602 Shortness of breath: Secondary | ICD-10-CM | POA: Diagnosis not present

## 2021-05-01 LAB — ECHOCARDIOGRAM STRESS TEST
Area-P 1/2: 2.53 cm2
S' Lateral: 2.7 cm

## 2021-05-01 MED ORDER — PERFLUTREN LIPID MICROSPHERE
1.0000 mL | INTRAVENOUS | Status: AC | PRN
  Administered 2021-05-01 (×4): 2 mL via INTRAVENOUS

## 2021-05-01 MED ORDER — ATROPINE SULFATE 1 MG/10ML IJ SOSY
0.2500 mg | PREFILLED_SYRINGE | Freq: Once | INTRAMUSCULAR | Status: AC
  Administered 2021-05-01: 0.25 mg via INTRAVENOUS

## 2021-05-01 MED ORDER — ATROPINE SULFATE 1 MG/10ML IJ SOSY: 1.0000 mg | PREFILLED_SYRINGE | Freq: Once | INTRAMUSCULAR | Status: AC

## 2021-05-01 MED ORDER — SODIUM CHLORIDE 0.9 % IV SOLN
40.0000 ug/kg/min | Freq: Once | INTRAVENOUS | Status: AC
  Administered 2021-05-01: 4.228 mg/min via INTRAVENOUS

## 2021-05-04 ENCOUNTER — Other Ambulatory Visit: Payer: Self-pay

## 2021-05-04 ENCOUNTER — Telehealth: Payer: Self-pay | Admitting: Cardiology

## 2021-05-04 DIAGNOSIS — I77810 Thoracic aortic ectasia: Secondary | ICD-10-CM

## 2021-05-04 DIAGNOSIS — Z01812 Encounter for preprocedural laboratory examination: Secondary | ICD-10-CM

## 2021-05-04 NOTE — Telephone Encounter (Signed)
The patient has been notified of the result and verbalized understanding.  All questions (if any) were answered. Bernita Buffy, RN 05/04/2021 12:28 PM   Forwarded to Dr. Adrian Prince per Dr. Antoine Poche.

## 2021-05-04 NOTE — Telephone Encounter (Signed)
Spoke to [employer] with [place] CT calling to have bmet ordered before cta of aorta.Bmet order placed.

## 2021-05-22 ENCOUNTER — Encounter (INDEPENDENT_AMBULATORY_CARE_PROVIDER_SITE_OTHER): Payer: No Typology Code available for payment source | Admitting: Ophthalmology

## 2021-05-22 ENCOUNTER — Other Ambulatory Visit: Payer: Self-pay

## 2021-05-22 DIAGNOSIS — S0501XA Injury of conjunctiva and corneal abrasion without foreign body, right eye, initial encounter: Secondary | ICD-10-CM

## 2021-05-26 ENCOUNTER — Other Ambulatory Visit (HOSPITAL_COMMUNITY): Payer: Self-pay

## 2021-05-26 MED FILL — Atenolol Tab 50 MG: ORAL | 90 days supply | Qty: 270 | Fill #1 | Status: AC

## 2021-05-29 ENCOUNTER — Encounter (INDEPENDENT_AMBULATORY_CARE_PROVIDER_SITE_OTHER): Payer: No Typology Code available for payment source | Admitting: Ophthalmology

## 2021-06-03 LAB — BASIC METABOLIC PANEL
BUN/Creatinine Ratio: 17 (ref 9–20)
BUN: 15 mg/dL (ref 6–24)
CO2: 25 mmol/L (ref 20–29)
Calcium: 9.5 mg/dL (ref 8.7–10.2)
Chloride: 103 mmol/L (ref 96–106)
Creatinine, Ser: 0.89 mg/dL (ref 0.76–1.27)
Glucose: 133 mg/dL — ABNORMAL HIGH (ref 70–99)
Potassium: 4.4 mmol/L (ref 3.5–5.2)
Sodium: 141 mmol/L (ref 134–144)
eGFR: 103 mL/min/{1.73_m2} (ref >59)

## 2021-06-05 ENCOUNTER — Other Ambulatory Visit (HOSPITAL_COMMUNITY): Payer: Self-pay

## 2021-06-05 ENCOUNTER — Telehealth: Payer: Self-pay | Admitting: Cardiology

## 2021-06-05 ENCOUNTER — Ambulatory Visit: Admission: RE | Admit: 2021-06-05 | Payer: No Typology Code available for payment source | Source: Ambulatory Visit

## 2021-06-05 ENCOUNTER — Other Ambulatory Visit: Payer: Self-pay

## 2021-06-05 NOTE — Telephone Encounter (Signed)
Patient wanted to cancel his CT for today's visit due to not knowing what his responsibility will be to have this test. Marcus Dickson is very frustrated in no one being able to tell him what his test will cost. Please call Marcus Dickson with this estimate for the test. I have also given him the number to call for billing.

## 2021-06-06 ENCOUNTER — Ambulatory Visit (INDEPENDENT_AMBULATORY_CARE_PROVIDER_SITE_OTHER)
Admission: RE | Admit: 2021-06-06 | Discharge: 2021-06-06 | Disposition: A | Discharge status: Home / Self Care | Payer: No Typology Code available for payment source | Source: Ambulatory Visit | Attending: Cardiology | Admitting: Cardiology

## 2021-06-06 DIAGNOSIS — I7121 Aneurysm of the ascending aorta, without rupture: Secondary | ICD-10-CM

## 2021-06-06 DIAGNOSIS — I712 Thoracic aortic aneurysm, without rupture, unspecified: Secondary | ICD-10-CM | POA: Diagnosis not present

## 2021-06-06 MED ORDER — IOHEXOL 350 MG/ML SOLN
100.0000 mL | Freq: Once | INTRAVENOUS | Status: AC | PRN
  Administered 2021-06-06: 100 mL via INTRAVENOUS

## 2021-06-07 ENCOUNTER — Encounter (INDEPENDENT_AMBULATORY_CARE_PROVIDER_SITE_OTHER): Payer: No Typology Code available for payment source | Admitting: Ophthalmology

## 2021-06-07 ENCOUNTER — Other Ambulatory Visit (HOSPITAL_COMMUNITY): Payer: Self-pay

## 2021-06-07 ENCOUNTER — Other Ambulatory Visit: Payer: Self-pay

## 2021-06-07 DIAGNOSIS — I10 Essential (primary) hypertension: Secondary | ICD-10-CM

## 2021-06-07 DIAGNOSIS — H33301 Unspecified retinal break, right eye: Secondary | ICD-10-CM

## 2021-06-07 DIAGNOSIS — H35033 Hypertensive retinopathy, bilateral: Secondary | ICD-10-CM

## 2021-06-07 DIAGNOSIS — H43813 Vitreous degeneration, bilateral: Secondary | ICD-10-CM

## 2021-06-07 MED ORDER — ICOSAPENT ETHYL 1 G PO CAPS
2.0000 g | ORAL_CAPSULE | Freq: Two times a day (BID) | ORAL | 3 refills | Status: DC
  Filled 2021-06-07: qty 240, 60d supply, fill #0
  Filled 2021-08-18: qty 360, 90d supply, fill #1
  Filled 2021-11-16: qty 360, 90d supply, fill #2
  Filled 2022-02-22: qty 360, 90d supply, fill #3
  Filled 2022-05-17: qty 360, 90d supply, fill #4

## 2021-06-08 ENCOUNTER — Other Ambulatory Visit (HOSPITAL_COMMUNITY): Payer: Self-pay

## 2021-06-12 ENCOUNTER — Other Ambulatory Visit (HOSPITAL_COMMUNITY): Payer: Self-pay

## 2021-06-26 ENCOUNTER — Ambulatory Visit: Payer: No Typology Code available for payment source | Admitting: Cardiology

## 2021-08-02 ENCOUNTER — Other Ambulatory Visit (HOSPITAL_COMMUNITY): Payer: Self-pay

## 2021-08-02 MED ORDER — OZEMPIC (1 MG/DOSE) 4 MG/3ML ~~LOC~~ SOPN
1.0000 mg | PEN_INJECTOR | SUBCUTANEOUS | 6 refills | Status: DC
  Filled 2021-08-02: qty 3, 28d supply, fill #0
  Filled 2021-09-16: qty 3, 28d supply, fill #1
  Filled 2021-10-15: qty 3, 28d supply, fill #2

## 2021-08-03 ENCOUNTER — Other Ambulatory Visit (HOSPITAL_COMMUNITY): Payer: Self-pay

## 2021-08-18 ENCOUNTER — Other Ambulatory Visit (HOSPITAL_COMMUNITY): Payer: Self-pay

## 2021-08-18 MED FILL — Atenolol Tab 50 MG: ORAL | 90 days supply | Qty: 270 | Fill #2 | Status: AC

## 2021-09-18 ENCOUNTER — Other Ambulatory Visit (HOSPITAL_COMMUNITY): Payer: Self-pay

## 2021-09-19 ENCOUNTER — Encounter: Payer: Self-pay | Admitting: Cardiology

## 2021-09-19 ENCOUNTER — Ambulatory Visit (INDEPENDENT_AMBULATORY_CARE_PROVIDER_SITE_OTHER): Payer: No Typology Code available for payment source | Admitting: Cardiology

## 2021-09-19 ENCOUNTER — Other Ambulatory Visit: Payer: Self-pay

## 2021-09-19 VITALS — BP 108/76 | HR 87 | Ht 69.5 in | Wt 229.4 lb

## 2021-09-19 DIAGNOSIS — I2510 Atherosclerotic heart disease of native coronary artery without angina pectoris: Secondary | ICD-10-CM | POA: Diagnosis not present

## 2021-09-19 NOTE — Patient Instructions (Signed)

## 2021-09-19 NOTE — Progress Notes (Signed)
Cardiology Office Note   Date:  09/19/2021   ID:  [name], DOB [birth year], MRN [id number]  PCP:  Adrian Prince, MD  Cardiologist:   Rollene Rotunda, MD   Chief Complaint  Patient presents with   Coronary Artery Disease       History of Present Illness: Marcus Dickson is a 51 y.o. male who presents for a history of coronary calcium.  We found this on screening because he has a very strong family history of early onset coronary artery disease. He had a negative POET (Plain Old Exercise Treadmill) last in Feb of 2019.  He had dyspnea.  I sent him for coronary CTA.  He had non obstructive disease as below.  He does have a mildly enlarged aortic root.     He had a follow up negative POET (Plain Old Exercise Treadmill).  However, he did only get his heart rate to 82% of predicted was limited somewhat by shortness of breath.  He comes back to discuss this.  Since I last sat him he had a CT of his chest which demonstrated 4 cm stable sinus of Valsalva dilatation.   He has done well.  Overall he is lost about 60 pounds from his peak.  He is walking routinely for exercise and watching what he eats. The patient denies any new symptoms such as chest discomfort, neck or arm discomfort. There has been no new shortness of breath, PND or orthopnea. There have been no reported palpitations, presyncope or syncope.  Past Medical History:  Diagnosis Date   Diabetes ([facility])    Dyslipidemia    Low HDL   High coronary artery calcium score    Hypertension    Overweight    Sleep apnea     Past Surgical History:  Procedure Laterality Date   FINGER SURGERY     VASECTOMY       Current Outpatient Medications  Medication Sig Dispense Refill   Alirocumab (PRALUENT) 150 MG/ML SOAJ INJECT 150 MG UNDER THE SKIN EVERY 2 WEEKS 6 mL 2   aspirin 81 MG tablet Take 81 mg by mouth daily.     atenolol (TENORMIN) 50 MG tablet TAKE 1 TABLET BY MOUTH EVERY MORNING AND 2 TABLETS EVERY EVENING 270 tablet 3    cyclobenzaprine (FLEXERIL) 10 MG tablet TAKE 1 TABLET BY MOUTH TWICE DAILY AS NEEDED 60 tablet 1   Empagliflozin-metFORMIN HCl ER (SYNJARDY XR) 25-1000 MG TB24 Take 1 tablet by mouth daily. 90 tablet 3   icosapent Ethyl (VASCEPA) 1 g capsule Take 2 capsules (2 g total) by mouth 2 (two) times daily. 360 capsule 3   losartan (COZAAR) 50 MG tablet Take 1 tablet (50 mg total) by mouth daily. 90 tablet 3   meloxicam (MOBIC) 7.5 MG tablet Take 7.5 mg by mouth as needed.     Semaglutide, 1 MG/DOSE, (OZEMPIC, 1 MG/DOSE,) 4 MG/3ML SOPN Inject 1 mg into the skin once a week. 3 mL 6   No current facility-administered medications for this visit.   Facility-Administered Medications Ordered in Other Visits  Medication Dose Route Frequency Provider Last Rate Last Admin   atropine 1 MG/10ML injection 1 mg  1 mg Intravenous Once Rollene Rotunda, MD        Allergies:   Patient has no known allergies.   ROS:  Please see the history of present illness.   Otherwise, review of systems are positive for none.   All other systems are reviewed and negative.  PHYSICAL EXAM: VS:  BP 108/76    Pulse 87    Ht 5' 9.5" (1.765 m)    Wt 229 lb 6.4 oz (104.1 kg)    SpO2 99%    BMI 33.39 kg/m  , BMI Body mass index is 33.39 kg/m. GENERAL:  Well appearing NECK:  No jugular venous distention, waveform within normal limits, carotid upstroke brisk and symmetric, no bruits, no thyromegaly LUNGS:  Clear to auscultation bilaterally CHEST:  Unremarkable HEART:  PMI not displaced or sustained,S1 and S2 within normal limits, no S3, no S4, no clicks, no rubs, no murmurs ABD:  Flat, positive bowel sounds normal in frequency in pitch, no bruits, no rebound, no guarding, no midline pulsatile mass, no hepatomegaly, no splenomegaly EXT:  2 plus pulses throughout, no edema, no cyanosis no clubbing  CT:    Coronary Arteries:  Normal coronary origin.  Left dominance.   RCA is a small non-dominant artery giving rise to a small RV  branch. There is minimal calcification in the mid RCA causing minimal non obstructive CAD (0-24%)   Left main is a large artery that gives rise to LAD and LCX arteries.   LAD is a large vessel that has heavily calcified plaque in the proximal and mid segments causing at least moderate stenosis (50-69%). Challenging to rule out severe stenosis due to significant calcification causing calcium/blooming artifacts.   LCX is a dominant artery that gives rise 3 obtuse marginal branches  prior to suppling the PDA and PLA. There is severe calcified plaque in the mid and distal LCx (prior to PDA takeoff), causing moderate stenosis (50-69%).   EKG:  EKG is not ordered today. 12/19/2020 sinus rhythm, rate 63, low voltage in the limb leads, poor anterior R wave progression, no acute ST-T wave changes.  Recent Labs: 06/02/2021: BUN 15; Creatinine, Ser 0.89; Potassium 4.4; Sodium 141    Lipid Panel    Component Value Date/Time   CHOL 127 09/08/2018 0944   TRIG 189 (H) 09/08/2018 0944   HDL 36 (L) 09/08/2018 0944   CHOLHDL 3.5 09/08/2018 0944   CHOLHDL 3.8 07/24/2016 0923   VLDL 26 07/24/2016 0923   LDLCALC 53 09/08/2018 0944      Wt Readings from Last 3 Encounters:  09/19/21 229 lb 6.4 oz (104.1 kg)  05/01/21 233 lb (105.7 kg)  03/17/21 233 lb 6.4 oz (105.9 kg)      Other studies Reviewed: Additional studies/ records that were reviewed today include: Labs Review of the above records demonstrates:  Please see elsewhere in the note.     ASSESSMENT AND PLAN:   CAD:   The patient has no new sypmtoms.  No further cardiovascular testing is indicated.  We will continue with aggressive risk reduction and meds as listed.  We have decided to continue to manage this medically with optimal medical therapy and he has been very compliant.  He would let me know if he has any symptoms.  ENLARGED AORTA:   This was 4 cm November and I will follow this up in 2 years.   DM:   A1c was 6.6 and  followed by Dr. Evlyn Kanner.  DYSLIPIDEMIA:      His LDL 4 with an HDL of 36.  He will continue the meds as listed.   SLEEP APNEA:    He is now following with Dr. Vickey Huger   HTN:   The blood pressure is well controlled.  No change in therapy.    Current medicines are reviewed  at length with the patient today.  The patient does not have concerns regarding medicines.  The following changes have been made: None  Labs/ tests ordered today include:    No orders of the defined types were placed in this encounter.     Disposition:   FU with me in 12 months  Signed, Rollene Rotunda, MD  09/19/2021 2:44 PM    [facility]

## 2021-09-22 ENCOUNTER — Ambulatory Visit: Payer: No Typology Code available for payment source | Admitting: Cardiology

## 2021-10-16 ENCOUNTER — Other Ambulatory Visit (HOSPITAL_COMMUNITY): Payer: Self-pay

## 2021-11-14 ENCOUNTER — Other Ambulatory Visit (HOSPITAL_COMMUNITY): Payer: Self-pay

## 2021-11-14 MED ORDER — OZEMPIC (1 MG/DOSE) 4 MG/3ML ~~LOC~~ SOPN
1.0000 mg | PEN_INJECTOR | SUBCUTANEOUS | 3 refills | Status: DC
  Filled 2021-11-14: qty 9, 84d supply, fill #0
  Filled 2022-02-22: qty 9, 84d supply, fill #1
  Filled 2022-05-17: qty 9, 84d supply, fill #2
  Filled 2022-08-16 – 2022-08-20 (×2): qty 3, 28d supply, fill #3
  Filled 2022-09-13: qty 6, 56d supply, fill #4

## 2021-11-16 ENCOUNTER — Other Ambulatory Visit: Payer: Self-pay | Admitting: Cardiology

## 2021-11-16 ENCOUNTER — Other Ambulatory Visit (HOSPITAL_COMMUNITY): Payer: Self-pay

## 2021-11-17 ENCOUNTER — Other Ambulatory Visit (HOSPITAL_COMMUNITY): Payer: Self-pay

## 2021-11-17 MED ORDER — ATENOLOL 50 MG PO TABS
ORAL_TABLET | ORAL | 3 refills | Status: DC
  Filled 2021-11-17: qty 270, 90d supply, fill #0
  Filled 2022-02-22: qty 270, 90d supply, fill #1
  Filled 2022-05-17: qty 270, 90d supply, fill #2
  Filled 2022-08-16: qty 270, 90d supply, fill #3

## 2021-11-17 MED ORDER — PRALUENT 150 MG/ML ~~LOC~~ SOAJ
150.0000 mg | SUBCUTANEOUS | 2 refills | Status: DC
  Filled 2021-11-17: qty 6, 84d supply, fill #0
  Filled 2022-02-22: qty 6, 84d supply, fill #1
  Filled 2022-05-17: qty 6, 84d supply, fill #2

## 2021-12-28 NOTE — Progress Notes (Signed)
PATIENT: Marcus Dickson E Fawbush DOB: 04/10/1971  REASON FOR VISIT: follow up HISTORY FROM: patient  Chief Complaint  Patient presents with   Obstructive Sleep Apnea    Rm 11, alone. Here for yearly CPAP f/u. Pt has lost 25lbs and would like to discuss continuation of CPAP.     HISTORY OF PRESENT ILLNESS:  01/01/22 ALL: Micah NoelLance returns for follow up for OSA on CPAP. He was last seen by Dr Vickey Hugerohmeier 12/2020 and reported worsening daytime sleepiness and fatigue. Compliance was 70%. AHI well managed. ESS 2-3/24, FSS 23/63. He was encouraged to continue CPAP nightly for at least 4 hours. Since, he does not like using CPAP. Although he does use therapy nightly, he feels very uncomfortable using CPAP. He travels often for work and does not like transporting CPAP. He is using a FFM. He does have an air leak.   He was started on Ozempic about 6 months ago. He has been working on healthy lifestyle habits. He is down 13lbs from last visit 12/2020 and 30lbs from sleep study in 2019. He reports highest weight was around 275 in 2018.     09/14/19 ALL (Mychart): Marcus Dickson E Fahmy is a 51 y.o. male here today for follow up for OSa on CPAP. He admits that he has not been as compliant as he should. He continues to have difficulty with an air leak. He also has difficulty finding the correct tempeture for the humidity. He does note increased fatigue when he is not using CPAP.    Compliance report dated 08/11/2019 through 09/09/2019 reveals that he used CPAP 20 one of the last 30 days for compliance of 70%.  He used CPAP greater than 4 hours 15 of the last 30 days for compliance of 50%.  Average usage was 5 hours and 16 minutes.  Residual AHI was 0.9 on 6 to 16 cm of water and an EPR of 3.  There was a significant leak noted in the 95th percentile of 22.2.  09/11/2018 ALL:  Marcus Dickson E Kirchner is a 51 y.o. male here today for follow up.  He is feeling well without complaints today.  His download compliance report shows he is  using his CPAP 29 out of 30 days for compliance denies any percent.  He is using his machine 24 out of 30 days for compliance of 80%.  He is using his machine on average 5 hours and 11 minutes.  His AHI was 1.4 on 6 to 16 cm of water with a EPR of 3.  There is no significant leak.  He has not noticed much of a difference in his sleep.  He continues to work different shifts however he has had a conversation with his employer to limit this.  He has been using his machine since 30 May 2018.   HISTORY: (copied from Dr Vickey Hugerohmeier note on 03/13/2018) HPI:  Marcus Dickson E Brobeck is a 51 y.o. 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 Evlyn KannerSouth for a sleep apnea evaluation.    Chief complaint according to patient : DOT physical. DM, HTN, Obesity.    Sleep habits are as follows: irregular work hours determine irregular sleep times.  He may work between 4 AM and until 10 PM, and he travels a lot with an Administrator, sportsinstallment crew.  [name] is not primarily a driver but his company requires a driver for each work crew.  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    Whenever he can retreat to the bedroom he is asleep in 15 seconds and his wife reports he snores soon after - but not continuously, his wife tries to get him off his back. He can't sleep very long on his sides as his shoulders hurt. The bedroom is dark, cool at 68 F, quiet. He always wakes up at 7.30 AM while on the road or at home. - no matter when he went to bed. Even when not on the road he gets phone calls, but works mostly day shift.  He doesn't wake choking -    Average sleep duration at home 7 hours, on the road - anything possible. He wakes up congested and with a parched mouth, has a lot mucous.     Sleep medical history and family sleep history:  Snoring for several years , as he gained weight - he has lost 30 pounds since 2018 and is able to keep it off.  Retrognathia.   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 Non smoker, ETOH- seldomly .  Caffeine : 2 cups a day of coffee, shift worker - swing shifts.   REVIEW OF SYSTEMS: Out of a complete 14 system review of symptoms, the patient complains only of the following symptoms, none and all other reviewed systems are negative.  Epworth sleepiness scale: 2  ALLERGIES: No Known Allergies  HOME MEDICATIONS: Outpatient Medications Prior to Visit  Medication Sig Dispense Refill   Alirocumab (PRALUENT) 150 MG/ML SOAJ Inject 150 mg under the skin every 2 weeks 6 mL 2   aspirin 81 MG tablet Take 81 mg by mouth daily.     atenolol (TENORMIN) 50 MG tablet Take 1 tablet (50 mg total) by mouth in the morning AND 2 tablets (100 mg total) every evening. 270 tablet 3   Empagliflozin-metFORMIN HCl ER (SYNJARDY XR) 25-1000 MG TB24 Take 1 tablet by mouth daily. 90 tablet 3   icosapent Ethyl (VASCEPA) 1 g capsule Take 2 capsules (2 g total) by mouth 2 (two) times daily. 360 capsule 3   losartan (COZAAR) 50 MG tablet Take 1 tablet (50 mg total) by mouth daily. 90 tablet 3   Semaglutide, 1 MG/DOSE, (OZEMPIC, 1 MG/DOSE,) 4 MG/3ML SOPN Inject 1 mg into the skin once a week. 3 mL 6   Semaglutide, 1 MG/DOSE, (OZEMPIC, 1 MG/DOSE,) 4 MG/3ML SOPN Inject 1 mg into the skin once a week. 9 mL 3   meloxicam (MOBIC) 7.5 MG tablet Take 7.5 mg by mouth as needed.     Facility-Administered Medications Prior to Visit  Medication Dose Route Frequency Provider Last Rate Last Admin   atropine 1 MG/10ML injection 1 mg  1 mg Intravenous Once Rollene Rotunda, MD        PAST MEDICAL HISTORY: Past Medical History:  Diagnosis Date   Diabetes (HCC)    Dyslipidemia    Low HDL   High coronary artery calcium score    Hypertension    Overweight    Sleep apnea     PAST SURGICAL HISTORY: Past Surgical History:  Procedure Laterality  Date   FINGER SURGERY     VASECTOMY      FAMILY HISTORY: Family History  Problem Relation Age of Onset   CAD Father 40       Died MI -  was a smoker   Heart attack Father    Stroke Father    CAD Mother 45       CABG; post op cardiac arrest during knee surgery    Heart attack Mother    Stroke Paternal Grandmother     SOCIAL HISTORY: Social History   Socioeconomic History   Marital status: Married    Spouse name: Not on file   Number of children: 2   Years of education: Not on file   Highest education level: Not on file  Occupational History   Occupation: Teaching laboratory technician: [employer]  Tobacco Use   Smoking status: Never   Smokeless tobacco: Never  Vaping Use   Vaping Use: Never used  Substance and Sexual Activity   Alcohol use: No   Drug use: No   Sexual activity: Yes  Other Topics Concern   Not on file  Social History Narrative   Not on file   Social Determinants of Health   Financial Resource Strain: Not on file  Food Insecurity: Not on file  Transportation Needs: Not on file  Physical Activity: Not on file  Stress: Not on file  Social Connections: Not on file  Intimate Partner Violence: Not on file      PHYSICAL EXAM  Vitals:   01/01/22 0825  BP: 121/84  Pulse: 85  Weight: 218 lb 8 oz (99.1 kg)  Height: 5' 9.5" (1.765 m)    Body mass index is 31.8 kg/m.  Generalized: Well developed, in no acute distress  Cardiology: normal rate and rhythm, no murmur noted Respiratory: Clear to auscultation bilaterally Mallampati 3+, neck circumference 19.5 inches Neurological examination  Mentation: Alert oriented to time, place, history taking. Follows all commands speech and language fluent Cranial nerve II-XII: Pupils were equal round reactive to light. Extraocular movements were full, visual field were full on confrontational test. Facial sensation and strength were normal. Head turning and shoulder shrug  were normal and  symmetric. Motor: The motor testing reveals 5 over 5 strength of all 4 extremities. Good symmetric motor tone is noted throughout.  Gait and station: Gait is normal.    DIAGNOSTIC DATA (LABS, IMAGING, TESTING) - I reviewed patient records, labs, notes, testing and imaging myself where available.      View : No data to display.           Lab Results  Component Value Date   WBC 6.9 07/13/2016   HGB 15.5 07/13/2016   HCT 42.6 (A) 07/13/2016   MCV 81.2 07/13/2016   PLT 271 03/13/2010      Component Value Date/Time   NA 141 06/02/2021 1350   K 4.4 06/02/2021 1350   CL 103 06/02/2021 1350   CO2 25 06/02/2021 1350   GLUCOSE 133 (H) 06/02/2021 1350   GLUCOSE 87 03/13/2010 1044   BUN 15 06/02/2021 1350   CREATININE 0.89 06/02/2021 1350   CALCIUM 9.5 06/02/2021 1350   PROT 7.4 09/08/2018 0944   ALBUMIN 4.7 09/08/2018 0944   AST 85 (H) 09/08/2018 0944   ALT 107 (H) 09/08/2018 0944   ALKPHOS 55 09/08/2018 0944   BILITOT 0.3 09/08/2018 0944   GFRNONAA 107 11/23/2019 1216   GFRAA 124 11/23/2019 1216   Lab Results  Component Value Date   CHOL 127 09/08/2018   HDL 36 (L) 09/08/2018   LDLCALC 53 09/08/2018   TRIG 189 (H) 09/08/2018   CHOLHDL 3.5 09/08/2018   Lab Results  Component Value Date  HGBA1C 10.6 (H) 07/26/2016   No results found for: VITAMINB12 No results found for: TSH   ASSESSMENT AND PLAN 51 y.o. year old male  has a past medical history of Diabetes (HCC), Dyslipidemia, High coronary artery calcium score, Hypertension, Overweight, and Sleep apnea. here with     ICD-10-CM   1. Complex sleep apnea syndrome  G47.31 For home use only DME continuous positive airway pressure (CPAP)    Home sleep test    2. OSA on CPAP  G47.33 For home use only DME continuous positive airway pressure (CPAP)   Z99.89 Home sleep test       Mr. Schulke is doing well overall. Compliance report shows excellent compliance. AHI well managed. He does have an air leak and will  monitor at home. He wishes to repeat HST following at least 30 pound weight loss since HST showed severe complex apnea with AHI 36/hr. We have discussed previous results. No known family history of sleep apnea. He was encouraged to continue nightly use of CPAP for at least 4 hours. Supply orders placed. Healthy lifestyle habits encouraged. He will continue follow up with PCP as directed. He will follow-up with [place] in 1 year, sooner as needed.    Orders Placed This Encounter  Procedures   For home use only DME continuous positive airway pressure (CPAP)    Supplies    Order Specific Question:   Length of Need    Answer:   Lifetime    Order Specific Question:   Patient has OSA or probable OSA    Answer:   Yes    Order Specific Question:   Is the patient currently using CPAP in the home    Answer:   Yes    Order Specific Question:   Settings    Answer:   Other see comments    Order Specific Question:   CPAP supplies needed    Answer:   Mask, headgear, cushions, filters, heated tubing and water chamber   Home sleep test    Standing Status:   Future    Standing Expiration Date:   01/02/2023    Order Specific Question:   Where should this test be performed:    Answer:   [facility]     No orders of the defined types were placed in this encounter.    Shawnie Dapper, FNP-C 01/01/2022, 8:53 AM [facility] [address], [address] [place], [place] [address] [phone]

## 2021-12-28 NOTE — Patient Instructions (Addendum)
Please continue using your CPAP regularly. While your insurance requires that you use CPAP at least 4 hours each night on 70% of the nights, I recommend, that you not skip any nights and use it throughout the night if you can. Getting used to CPAP and staying with the treatment long term does take time and patience and discipline. Untreated obstructive sleep apnea when it is moderate to severe can have an adverse impact on cardiovascular health and raise her risk for heart disease, arrhythmias, hypertension, congestive heart failure, stroke and diabetes. Untreated obstructive sleep apnea causes sleep disruption, nonrestorative sleep, and sleep deprivation. This can have an impact on your day to day functioning and cause daytime sleepiness and impairment of cognitive function, memory loss, mood disturbance, and problems focussing. Using CPAP regularly can improve these symptoms.  We will order a repeat Home Sleep Test to reevaluate apnea following weight loss. Please let me know if you do not hear back in 2 weeks. Continue CPAP nightly for now.   Follow up in 1 year, sooner if needed

## 2022-01-01 ENCOUNTER — Encounter: Payer: Self-pay | Admitting: Family Medicine

## 2022-01-01 ENCOUNTER — Ambulatory Visit: Payer: No Typology Code available for payment source | Admitting: Family Medicine

## 2022-01-01 VITALS — BP 121/84 | HR 85 | Ht 69.5 in | Wt 218.5 lb

## 2022-01-01 DIAGNOSIS — G4731 Primary central sleep apnea: Secondary | ICD-10-CM

## 2022-01-01 DIAGNOSIS — Z9989 Dependence on other enabling machines and devices: Secondary | ICD-10-CM

## 2022-01-01 DIAGNOSIS — G4733 Obstructive sleep apnea (adult) (pediatric): Secondary | ICD-10-CM | POA: Diagnosis not present

## 2022-01-02 NOTE — Progress Notes (Signed)
CM sent to [facility] for new order ?

## 2022-02-07 ENCOUNTER — Telehealth: Payer: Self-pay | Admitting: Family Medicine

## 2022-02-07 NOTE — Telephone Encounter (Signed)
spoke to the patient he asked for me to call him tomorrow 02/08/22  [facility] no auth req spoke to [place] ref # [id number]

## 2022-02-08 ENCOUNTER — Telehealth: Payer: Self-pay | Admitting: Family Medicine

## 2022-02-08 NOTE — Telephone Encounter (Signed)
LVM for pt to call back to schedule   On 02/07/22 I  spoke to the patient he asked for me to call him tomorrow 02/08/22

## 2022-02-20 NOTE — Telephone Encounter (Signed)
x2 LVM for pt to call back  

## 2022-02-22 ENCOUNTER — Other Ambulatory Visit: Payer: Self-pay | Admitting: Cardiology

## 2022-02-22 ENCOUNTER — Other Ambulatory Visit (HOSPITAL_COMMUNITY): Payer: Self-pay

## 2022-02-22 DIAGNOSIS — I10 Essential (primary) hypertension: Secondary | ICD-10-CM

## 2022-02-22 MED ORDER — SYNJARDY XR 25-1000 MG PO TB24
1.0000 | ORAL_TABLET | Freq: Every day | ORAL | 3 refills | Status: DC
  Filled 2022-02-22: qty 90, 90d supply, fill #0
  Filled 2022-05-17: qty 90, 90d supply, fill #1
  Filled 2022-08-16: qty 90, 90d supply, fill #2
  Filled 2022-11-21: qty 90, 90d supply, fill #3

## 2022-02-23 ENCOUNTER — Other Ambulatory Visit (HOSPITAL_COMMUNITY): Payer: Self-pay

## 2022-02-23 MED ORDER — LOSARTAN POTASSIUM 50 MG PO TABS
50.0000 mg | ORAL_TABLET | Freq: Every day | ORAL | 3 refills | Status: DC
  Filled 2022-02-23: qty 90, 90d supply, fill #0
  Filled 2022-05-17: qty 90, 90d supply, fill #1
  Filled 2022-08-16: qty 90, 90d supply, fill #2
  Filled 2022-11-21: qty 90, 90d supply, fill #3

## 2022-05-17 ENCOUNTER — Other Ambulatory Visit (HOSPITAL_COMMUNITY): Payer: Self-pay

## 2022-05-18 ENCOUNTER — Other Ambulatory Visit (HOSPITAL_COMMUNITY): Payer: Self-pay

## 2022-05-25 ENCOUNTER — Other Ambulatory Visit (HOSPITAL_COMMUNITY): Payer: Self-pay

## 2022-05-25 MED ORDER — ICOSAPENT ETHYL 1 G PO CAPS
2.0000 g | ORAL_CAPSULE | Freq: Two times a day (BID) | ORAL | 3 refills | Status: DC
  Filled 2022-05-25: qty 120, 30d supply, fill #0
  Filled 2022-05-28: qty 360, 90d supply, fill #0
  Filled 2022-08-16: qty 120, 30d supply, fill #1

## 2022-05-28 ENCOUNTER — Other Ambulatory Visit (HOSPITAL_COMMUNITY): Payer: Self-pay

## 2022-06-19 ENCOUNTER — Encounter (INDEPENDENT_AMBULATORY_CARE_PROVIDER_SITE_OTHER): Payer: No Typology Code available for payment source | Admitting: Ophthalmology

## 2022-06-19 DIAGNOSIS — I10 Essential (primary) hypertension: Secondary | ICD-10-CM

## 2022-06-19 DIAGNOSIS — H43813 Vitreous degeneration, bilateral: Secondary | ICD-10-CM

## 2022-06-19 DIAGNOSIS — H33301 Unspecified retinal break, right eye: Secondary | ICD-10-CM

## 2022-06-19 DIAGNOSIS — H35033 Hypertensive retinopathy, bilateral: Secondary | ICD-10-CM

## 2022-08-16 ENCOUNTER — Other Ambulatory Visit (HOSPITAL_COMMUNITY): Payer: Self-pay

## 2022-08-17 ENCOUNTER — Other Ambulatory Visit: Payer: Self-pay

## 2022-08-17 ENCOUNTER — Other Ambulatory Visit (HOSPITAL_COMMUNITY): Payer: Self-pay

## 2022-08-17 MED ORDER — PRALUENT 150 MG/ML ~~LOC~~ SOAJ
150.0000 mg | SUBCUTANEOUS | 3 refills | Status: DC
  Filled 2022-08-17 – 2022-11-22 (×2): qty 6, 84d supply, fill #0
  Filled 2023-02-14: qty 6, 84d supply, fill #1
  Filled 2023-05-12: qty 6, 84d supply, fill #2
  Filled 2023-08-11 – 2023-08-16 (×4): qty 2, 28d supply, fill #3

## 2022-08-17 MED ORDER — PRALUENT 150 MG/ML ~~LOC~~ SOAJ
150.0000 mg | SUBCUTANEOUS | 3 refills | Status: DC
  Filled 2022-08-17: qty 6, 90d supply, fill #0
  Filled 2022-08-24 (×2): qty 6, 84d supply, fill #0

## 2022-08-20 ENCOUNTER — Other Ambulatory Visit (HOSPITAL_COMMUNITY): Payer: Self-pay

## 2022-08-20 MED ORDER — ICOSAPENT ETHYL 1 G PO CAPS
2.0000 g | ORAL_CAPSULE | Freq: Two times a day (BID) | ORAL | 3 refills | Status: DC
  Filled 2022-08-20: qty 360, 90d supply, fill #0

## 2022-08-21 ENCOUNTER — Other Ambulatory Visit (HOSPITAL_COMMUNITY): Payer: Self-pay

## 2022-08-21 MED ORDER — OZEMPIC (1 MG/DOSE) 4 MG/3ML ~~LOC~~ SOPN: PEN_INJECTOR | SUBCUTANEOUS | 3 refills | Status: DC

## 2022-08-24 ENCOUNTER — Other Ambulatory Visit (HOSPITAL_COMMUNITY): Payer: Self-pay

## 2022-08-25 ENCOUNTER — Other Ambulatory Visit (HOSPITAL_COMMUNITY): Payer: Self-pay

## 2022-08-27 ENCOUNTER — Other Ambulatory Visit (HOSPITAL_COMMUNITY): Payer: Self-pay

## 2022-08-28 ENCOUNTER — Other Ambulatory Visit (HOSPITAL_COMMUNITY): Payer: Self-pay

## 2022-09-14 ENCOUNTER — Other Ambulatory Visit (HOSPITAL_COMMUNITY): Payer: Self-pay

## 2022-09-14 ENCOUNTER — Other Ambulatory Visit: Payer: Self-pay

## 2022-09-14 MED ORDER — OZEMPIC (1 MG/DOSE) 4 MG/3ML ~~LOC~~ SOPN
1.0000 mg | PEN_INJECTOR | SUBCUTANEOUS | 3 refills | Status: DC
  Filled 2022-09-14 (×2): qty 9, 84d supply, fill #0

## 2022-10-11 NOTE — Progress Notes (Signed)
Cardiology Office Note   Date:  10/12/2022   ID:  Marcus Dickson, DOB 1970/09/28, MRN [id number]  PCP:  Reynold Bowen, MD  Cardiologist:   Minus Breeding, MD   Chief Complaint  Patient presents with   Coronary Artery Disease       History of Present Illness: Marcus Dickson is a 52 y.o. male who presents for a history of coronary calcium.  We found this on screening because he has a very strong family history of early onset coronary artery disease. He had a negative POET (Plain [employer] Exercise Treadmill) last in Feb of 2019.  He had dyspnea.  I sent him for coronary CTA.  He had non obstructive disease as below.  He does have a mildly enlarged aortic root.     He had a follow up negative POET (Plain Old Exercise Treadmill).  However, he did only get his heart rate to 82% of predicted was limited somewhat by shortness of breath.  He had a negative stress echo.  However, we managed him medically without further testing.   He had a CT of his chest which demonstrated 4 cm stable sinus of Valsalva dilatation.    Since I last saw him he has done well.  The patient denies any new symptoms such as chest discomfort, neck or arm discomfort. There has been no new shortness of breath, PND or orthopnea. There have been no reported palpitations, presyncope or syncope.   Past Medical History:  Diagnosis Date   Diabetes ([place])    Dyslipidemia    Low HDL   High coronary artery calcium score    Hypertension    Overweight    Sleep apnea     Past Surgical History:  Procedure Laterality Date   FINGER SURGERY     VASECTOMY       Current Outpatient Medications  Medication Sig Dispense Refill   Alirocumab (PRALUENT) 150 MG/ML SOAJ INJECT 150 MG UNDER THE SKIN EVERY 2 WEEKS 6 mL 3   aspirin 81 MG tablet Take 81 mg by mouth daily.     atenolol (TENORMIN) 50 MG tablet Take 1 tablet (50 mg total) by mouth in the morning AND 2 tablets (100 mg total) every evening. 270 tablet 3    Empagliflozin-metFORMIN HCl ER (SYNJARDY XR) 25-1000 MG TB24 Take 1 tablet by mouth daily. 90 tablet 3   losartan (COZAAR) 50 MG tablet Take 1 tablet (50 mg total) by mouth daily. 90 tablet 3   No current facility-administered medications for this visit.   Facility-Administered Medications Ordered in Other Visits  Medication Dose Route Frequency Provider Last Rate Last Admin   atropine 1 MG/10ML injection 1 mg  1 mg Intravenous Once Minus Breeding, MD        Allergies:   Patient has no known allergies.   ROS:  Please see the history of present illness.   Otherwise, review of systems are positive for none.   All other systems are reviewed and negative.    PHYSICAL EXAM: VS:  BP 112/88 (BP Location: Right Arm, Patient Position: Sitting, Cuff Size: Large)   Pulse 85   Ht 5' 9.5" (1.765 m)   Wt 223 lb 9.6 oz (101.4 kg)   SpO2 93%   BMI 32.55 kg/m  , BMI Body mass index is 32.55 kg/m. GENERAL:  Well appearing NECK:  No jugular venous distention, waveform within normal limits, carotid upstroke brisk and symmetric, no bruits, no thyromegaly LUNGS:  Clear to auscultation  bilaterally CHEST:  Unremarkable HEART:  PMI not displaced or sustained,S1 and S2 within normal limits, no S3, no S4, no clicks, no rubs, no murmurs ABD:  Flat, positive bowel sounds normal in frequency in pitch, no bruits, no rebound, no guarding, no midline pulsatile mass, no hepatomegaly, no splenomegaly EXT:  2 plus pulses throughout, no edema, no cyanosis no clubbing   CT:    Coronary Arteries:  Normal coronary origin.  Left dominance.   RCA is a small non-dominant artery giving rise to a small RV branch. There is minimal calcification in the mid RCA causing minimal non obstructive CAD (0-24%)   Left main is a large artery that gives rise to LAD and LCX arteries.   LAD is a large vessel that has heavily calcified plaque in the proximal and mid segments causing at least moderate stenosis (50-69%).  Challenging to rule out severe stenosis due to significant calcification causing calcium/blooming artifacts.   LCX is a dominant artery that gives rise 3 obtuse marginal branches  prior to suppling the PDA and PLA. There is severe calcified plaque in the mid and distal LCx (prior to PDA takeoff), causing moderate stenosis (50-69%).   EKG:  EKG is  ordered today. 12/19/2020 sinus rhythm, rate 85, low voltage in the limb leads, poor anterior R wave progression, no acute ST-T wave changes.  Recent Labs: No results found for requested labs within last 365 days.    Lipid Panel    Component Value Date/Time   CHOL 127 09/08/2018 0944   TRIG 189 (H) 09/08/2018 0944   HDL 36 (L) 09/08/2018 0944   CHOLHDL 3.5 09/08/2018 0944   CHOLHDL 3.8 07/24/2016 0923   VLDL 26 07/24/2016 0923   LDLCALC 53 09/08/2018 0944      Wt Readings from Last 3 Encounters:  10/12/22 223 lb 9.6 oz (101.4 kg)  01/01/22 218 lb 8 oz (99.1 kg)  09/19/21 229 lb 6.4 oz (104.1 kg)      Other studies Reviewed: Additional studies/ records that were reviewed today include: Labs Review of the above records demonstrates:  Please see elsewhere in the note.     ASSESSMENT AND PLAN:   CAD:  The patient has no new sypmtoms.  No further cardiovascular testing is indicated.  We will continue with aggressive risk reduction and meds as listed.  ENLARGED AORTA:   This was 4 cm.  I will follow this in November.    DM:   A1c was 5.4.  No change in therapy.   DYSLIPIDEMIA:      His LDL was 41.  HDL was 39.  No change in therapy.   SLEEP APNEA:    He following with Dr. Brett Fairy.  He has central sleep apnea and is not a candidate for implanted device.  He continues his CPAP.  HTN:   The blood pressure is well-controlled.  No change in therapy.  WEIGHT: I can grab related him on his weight loss which has been around 50 pounds or so.  No change in therapy.   Current medicines are reviewed at length with the patient today.   The patient does not have concerns regarding medicines.  The following changes have been made: None  Labs/ tests ordered today include:  None  No orders of the defined types were placed in this encounter.    Disposition:   FU with me in 12 months  Signed, Minus Breeding, MD  10/12/2022 4:33 PM    [place]

## 2022-10-12 ENCOUNTER — Encounter: Payer: Self-pay | Admitting: Cardiology

## 2022-10-12 ENCOUNTER — Ambulatory Visit: Payer: 59 | Attending: Cardiology | Admitting: Cardiology

## 2022-10-12 VITALS — BP 112/88 | HR 85 | Ht 69.5 in | Wt 223.6 lb

## 2022-10-12 DIAGNOSIS — I2510 Atherosclerotic heart disease of native coronary artery without angina pectoris: Secondary | ICD-10-CM

## 2022-10-12 DIAGNOSIS — I7121 Aneurysm of the ascending aorta, without rupture: Secondary | ICD-10-CM | POA: Diagnosis not present

## 2022-10-12 DIAGNOSIS — E118 Type 2 diabetes mellitus with unspecified complications: Secondary | ICD-10-CM

## 2022-10-12 DIAGNOSIS — I10 Essential (primary) hypertension: Secondary | ICD-10-CM | POA: Diagnosis not present

## 2022-10-12 DIAGNOSIS — E785 Hyperlipidemia, unspecified: Secondary | ICD-10-CM | POA: Diagnosis not present

## 2022-10-12 NOTE — Patient Instructions (Signed)
Medication Instructions:  Your physician recommends that you continue on your current medications as directed. Please refer to the Current Medication list given to you today.  *If you need a refill on your cardiac medications before your next appointment, please call your pharmacy*  Testing/Procedures: CTA chest/aorta due in November 2024  Follow-Up: At [facility], you and your health needs are our priority.  As part of our continuing mission to provide you with exceptional heart care, we have created designated Provider Care Teams.  These Care Teams include your primary Cardiologist (physician) and Advanced Practice Providers (APPs -  Physician Assistants and Nurse Practitioners) who all work together to provide you with the care you need, when you need it.  We recommend signing up for the patient portal called "MyChart".  Sign up information is provided on this After Visit Summary.  MyChart is used to connect with patients for Virtual Visits (Telemedicine).  Patients are able to view lab/test results, encounter notes, upcoming appointments, etc.  Non-urgent messages can be sent to your provider as well.   To learn more about what you can do with MyChart, go to [email or web address].    Your next appointment:   12 month(s)  Provider:   Minus Breeding, MD

## 2022-10-12 NOTE — Addendum Note (Signed)
Addended by: Patria Mane A on: 10/12/2022 04:35 PM   Modules accepted: Orders

## 2022-10-19 DIAGNOSIS — I2584 Coronary atherosclerosis due to calcified coronary lesion: Secondary | ICD-10-CM | POA: Diagnosis not present

## 2022-10-19 DIAGNOSIS — I2510 Atherosclerotic heart disease of native coronary artery without angina pectoris: Secondary | ICD-10-CM | POA: Diagnosis not present

## 2022-10-19 DIAGNOSIS — E669 Obesity, unspecified: Secondary | ICD-10-CM | POA: Diagnosis not present

## 2022-10-19 DIAGNOSIS — I700 Atherosclerosis of aorta: Secondary | ICD-10-CM | POA: Diagnosis not present

## 2022-10-19 DIAGNOSIS — I7121 Aneurysm of the ascending aorta, without rupture: Secondary | ICD-10-CM | POA: Diagnosis not present

## 2022-10-19 DIAGNOSIS — G4733 Obstructive sleep apnea (adult) (pediatric): Secondary | ICD-10-CM | POA: Diagnosis not present

## 2022-10-19 DIAGNOSIS — E1159 Type 2 diabetes mellitus with other circulatory complications: Secondary | ICD-10-CM | POA: Diagnosis not present

## 2022-10-19 DIAGNOSIS — K760 Fatty (change of) liver, not elsewhere classified: Secondary | ICD-10-CM | POA: Diagnosis not present

## 2022-10-19 DIAGNOSIS — E785 Hyperlipidemia, unspecified: Secondary | ICD-10-CM | POA: Diagnosis not present

## 2022-10-19 DIAGNOSIS — F5221 Male erectile disorder: Secondary | ICD-10-CM | POA: Diagnosis not present

## 2022-11-21 ENCOUNTER — Other Ambulatory Visit: Payer: Self-pay | Admitting: Cardiology

## 2022-11-22 ENCOUNTER — Other Ambulatory Visit: Payer: Self-pay

## 2022-11-22 ENCOUNTER — Other Ambulatory Visit (HOSPITAL_COMMUNITY): Payer: Self-pay

## 2022-11-22 MED ORDER — OZEMPIC (1 MG/DOSE) 4 MG/3ML ~~LOC~~ SOPN
1.0000 mg | PEN_INJECTOR | SUBCUTANEOUS | 3 refills | Status: DC
  Filled 2022-11-22: qty 3, 28d supply, fill #0
  Filled 2022-11-27: qty 9, 84d supply, fill #0
  Filled 2023-02-14: qty 9, 84d supply, fill #1
  Filled 2023-05-12: qty 9, 84d supply, fill #2
  Filled 2023-08-11 – 2023-08-12 (×2): qty 9, 84d supply, fill #3
  Filled 2023-08-14 – 2023-10-16 (×2): qty 3, 28d supply, fill #3

## 2022-11-22 MED ORDER — ICOSAPENT ETHYL 1 G PO CAPS
2.0000 g | ORAL_CAPSULE | Freq: Two times a day (BID) | ORAL | 0 refills | Status: DC
  Filled 2022-11-22 – 2023-08-11 (×2): qty 360, 90d supply, fill #0
  Filled 2023-08-14: qty 120, 30d supply, fill #0

## 2022-11-22 MED ORDER — ICOSAPENT ETHYL 1 G PO CAPS
2.0000 g | ORAL_CAPSULE | Freq: Two times a day (BID) | ORAL | 3 refills | Status: DC
  Filled 2022-11-22: qty 360, 90d supply, fill #0
  Filled 2023-02-14: qty 360, 90d supply, fill #1
  Filled 2023-05-12: qty 360, 90d supply, fill #2

## 2022-11-22 MED ORDER — ATENOLOL 50 MG PO TABS
ORAL_TABLET | ORAL | 3 refills | Status: DC
  Filled 2022-11-22: qty 270, 90d supply, fill #0
  Filled 2023-02-14: qty 270, 90d supply, fill #1
  Filled 2023-05-12: qty 270, 90d supply, fill #2
  Filled 2023-08-11 – 2023-08-23 (×2): qty 90, 30d supply, fill #3
  Filled 2023-09-23: qty 90, 30d supply, fill #4

## 2022-11-23 ENCOUNTER — Other Ambulatory Visit (HOSPITAL_COMMUNITY): Payer: Self-pay

## 2022-11-23 ENCOUNTER — Other Ambulatory Visit: Payer: Self-pay

## 2022-11-26 ENCOUNTER — Other Ambulatory Visit (HOSPITAL_COMMUNITY): Payer: Self-pay

## 2022-11-27 ENCOUNTER — Other Ambulatory Visit (HOSPITAL_COMMUNITY): Payer: Self-pay

## 2022-12-27 NOTE — Progress Notes (Signed)
PATIENT: Marcus Dickson DOB: 1970-12-10  REASON FOR VISIT: follow up HISTORY FROM: patient  Chief Complaint  Patient presents with   Room 1    Pt is here Alone. Pt states that he hates his CPAP Machine. Pt states that's his air on his machine is either too hot or too cold. Pt states that his CPAP Machine is a hassle when he is traveling.      HISTORY OF PRESENT ILLNESS:  01/07/23 ALL: Marcus Dickson returns for follow up for OSA on CPAP. He reports doing fairly well but has difficulty meeting compliance at times. He continues to travel. He does not like to pack up his machine. He feels that he struggles with air feeling too hot or too cold. He no longer snores. He does not feel as sleepy during the day. He does note medical benefits of using CPAP. He is eligible for a new machine and would like to consider getting a travel machine.     01/01/2022 ALL: Marcus Dickson returns for follow up for OSA on CPAP. He was last seen by Dr Vickey Huger 12/2020 and reported worsening daytime sleepiness and fatigue. Compliance was 70%. AHI well managed. ESS 2-3/24, FSS 23/63. He was encouraged to continue CPAP nightly for at least 4 hours. Since, he does not like using CPAP. Although he does use therapy nightly, he feels very uncomfortable using CPAP. He travels often for work and does not like transporting CPAP. He is using a FFM. He does have an air leak.   He was started on Ozempic about 6 months ago. He has been working on healthy lifestyle habits. He is down 13lbs from last visit 12/2020 and 30lbs from sleep study in 2019. He reports highest weight was around 275 in 2018.     09/14/19 ALL (Mychart): Marcus Dickson is a 52 y.o. male here today for follow up for OSa on CPAP. He admits that he has not been as compliant as he should. He continues to have difficulty with an air leak. He also has difficulty finding the correct tempeture for the humidity. He does note increased fatigue when he is not using CPAP.     Compliance report dated 08/11/2019 through 09/09/2019 reveals that he used CPAP 20 one of the last 30 days for compliance of 70%.  He used CPAP greater than 4 hours 15 of the last 30 days for compliance of 50%.  Average usage was 5 hours and 16 minutes.  Residual AHI was 0.9 on 6 to 16 cm of water and an EPR of 3.  There was a significant leak noted in the 95th percentile of 22.2.  09/11/2018 ALL:  Marcus Dickson is a 52 y.o. male here today for follow up.  He is feeling well without complaints today.  His download compliance report shows he is using his CPAP 29 out of 30 days for compliance denies any percent.  He is using his machine 24 out of 30 days for compliance of 80%.  He is using his machine on average 5 hours and 11 minutes.  His AHI was 1.4 on 6 to 16 cm of water with a EPR of 3.  There is no significant leak.  He has not noticed much of a difference in his sleep.  He continues to work different shifts however he has had a conversation with his employer to limit this.  He has been using his machine since 30 May 2018.   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 Evlyn Kanner for a sleep apnea evaluation.    Chief complaint according to patient : DOT physical. DM, HTN, Obesity.    Sleep habits are as follows: irregular work hours determine irregular sleep times.  He may work between 4 AM and until 10 PM, and he travels a lot with an Administrator, sports.  Mr. Lorenzano is not primarily a driver but his company requires a driver for each work crew.  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    Whenever he can retreat to the bedroom he is asleep in 15 seconds and his wife reports he snores soon after - but not continuously, his wife tries to get him off his back. He can't sleep very long on his sides as his shoulders hurt. The  bedroom is dark, cool at 68 F, quiet. He always wakes up at 7.30 AM while on the road or at home. - no matter when he went to bed. Even when not on the road he gets phone calls, but works mostly day shift.  He doesn't wake choking -    Average sleep duration at home 7 hours, on the road - anything possible. He wakes up congested and with a parched mouth, has a lot mucous.     Sleep medical history and family sleep history:  Snoring for several years , as he gained weight - he has lost 30 pounds since 2018 and is able to keep it off.  Retrognathia.  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 Non smoker, ETOH- seldomly .  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   REVIEW OF SYSTEMS: Out of a complete 14 system review of symptoms, the patient complains only of the following symptoms, none and all other reviewed systems are negative.  Epworth sleepiness scale: 2/24  ALLERGIES: Allergies  Allergen Reactions   Cortisone Other (See Comments)    HOME MEDICATIONS: Outpatient Medications Prior to Visit  Medication Sig Dispense Refill   Alirocumab (PRALUENT) 150 MG/ML SOAJ Inject 1 mL (150 mg total) into the skin every 14 (fourteen) days. 6 mL 3   aspirin 81 MG tablet Take 81 mg by mouth daily.     atenolol (TENORMIN) 50 MG tablet Take 1 tablet (50 mg total) by mouth in the morning AND 2 tablets (100 mg total) every evening. 270 tablet 3   Empagliflozin-metFORMIN HCl ER (SYNJARDY XR) 25-1000 MG TB24 Take 1 tablet by mouth daily. 90 tablet 3   icosapent Ethyl (VASCEPA) 1 g capsule Take 2 capsules (2 g total) by mouth 2 (two) times daily with meals 360 capsule 3   icosapent Ethyl (VASCEPA) 1 g capsule Take 2 capsules (2G) by mouth 2 (two) times daily, WITH MEALS 360 capsule 0   losartan (COZAAR) 50 MG tablet Take 1 tablet (50 mg total) by mouth daily. 90 tablet 3    Semaglutide, 1 MG/DOSE, (OZEMPIC, 1 MG/DOSE,) 4 MG/3ML SOPN Inject 1 mg into the skin once a week. 9 mL 3   Facility-Administered Medications Prior to Visit  Medication Dose Route Frequency Provider Last Rate Last Admin   atropine 1 MG/10ML injection 1 mg  1 mg Intravenous Once Rollene Rotunda, MD        PAST MEDICAL HISTORY: Past Medical History:  Diagnosis Date   Diabetes (HCC)    Dyslipidemia    Low HDL   High coronary artery calcium score    Hypertension    Overweight    Sleep apnea     PAST SURGICAL HISTORY: Past Surgical History:  Procedure Laterality Date   FINGER SURGERY     VASECTOMY      FAMILY HISTORY: Family History  Problem Relation Age of Onset   CAD Father 61       Died MI - was a smoker   Heart attack Father    Stroke Father    CAD Mother 59       CABG; post op cardiac arrest during knee surgery    Heart attack Mother    Stroke Paternal Grandmother     SOCIAL HISTORY: Social History   Socioeconomic History   Marital status: Married    Spouse name: Not on file   Number of children: 2   Years of education: Not on file   Highest education level: Not on file  Occupational History   Occupation: Teaching laboratory technician: [employer]  Tobacco Use   Smoking status: Never   Smokeless tobacco: Never  Vaping Use   Vaping Use: Never used  Substance and Sexual Activity   Alcohol use: No   Drug use: No   Sexual activity: Yes  Other Topics Concern   Not on file  Social History Narrative   Right Handed   1 Cup of Coffee per Day   Social Determinants of Health   Financial Resource Strain: Not on file  Food Insecurity: Not on file  Transportation Needs: Not on file  Physical Activity: Not on file  Stress: Not on file  Social Connections: Not on file  Intimate Partner Violence: Not on file      PHYSICAL EXAM  Vitals:   01/07/23 0802  BP: 126/84  Pulse: 81  Weight: 220 lb (99.8 kg)  Height: 5\' 8"  (1.727 m)     Body  mass index is 33.45 kg/m.  Generalized: Well developed, in no acute distress  Cardiology: normal rate and rhythm, no murmur noted Respiratory: Clear to auscultation bilaterally Mallampati 3+, neck circumference 19.5 inches Neurological examination  Mentation: Alert oriented to time, place, history taking. Follows all commands speech and language fluent Cranial nerve II-XII: Pupils were equal round reactive to light. Extraocular movements were full, visual field were full on confrontational test. Facial sensation and strength were normal. Head turning and shoulder shrug  were normal and symmetric. Motor: The motor testing reveals 5 over 5 strength of all 4 extremities. Good symmetric motor tone is noted throughout.  Gait and station: Gait is normal.    DIAGNOSTIC DATA (LABS, IMAGING, TESTING) - I reviewed patient records, labs, notes, testing and imaging myself where available.      No data to display           Lab Results  Component Value Date   WBC 6.9 07/13/2016   HGB 15.5 07/13/2016   HCT 42.6 (A) 07/13/2016   MCV 81.2 07/13/2016   PLT 271 03/13/2010      Component Value Date/Time   NA 141 06/02/2021 1350   K 4.4 06/02/2021 1350   CL 103 06/02/2021 1350   CO2 25 06/02/2021 1350   GLUCOSE 133 (H) 06/02/2021 1350   GLUCOSE 87 03/13/2010  1044   BUN 15 06/02/2021 1350   CREATININE 0.89 06/02/2021 1350   CALCIUM 9.5 06/02/2021 1350   PROT 7.4 09/08/2018 0944   ALBUMIN 4.7 09/08/2018 0944   AST 85 (H) 09/08/2018 0944   ALT 107 (H) 09/08/2018 0944   ALKPHOS 55 09/08/2018 0944   BILITOT 0.3 09/08/2018 0944   GFRNONAA 107 11/23/2019 1216   GFRAA 124 11/23/2019 1216   Lab Results  Component Value Date   CHOL 127 09/08/2018   HDL 36 (L) 09/08/2018   LDLCALC 53 09/08/2018   TRIG 189 (H) 09/08/2018   CHOLHDL 3.5 09/08/2018   Lab Results  Component Value Date   HGBA1C 10.6 (H) 07/26/2016   No results found for: "VITAMINB12" No results found for:  "TSH"   ASSESSMENT AND PLAN 52 y.o. year old male  has a past medical history of Diabetes (HCC), Dyslipidemia, High coronary artery calcium score, Hypertension, Overweight, and Sleep apnea. here with     ICD-10-CM   1. OSA on CPAP  G47.33 For home use only DME continuous positive airway pressure (CPAP)    Home sleep test    2. Complex sleep apnea syndrome  G47.31 For home use only DME continuous positive airway pressure (CPAP)    Home sleep test      Mr. Helferich is doing well overall. Most recent compliance report shows sub optimal compliance, however, he is typically compliant with use. AHI well managed. He does have an air leak and will monitor at home. He is eligible for a new machine and would like to consider a travel machine. We will repeat HST. He was encouraged to continue nightly use of CPAP for at least 4 hours. Supply orders placed. Healthy lifestyle habits encouraged. He will continue follow up with PCP as directed. He will follow-up with me pending sleep study results.    Orders Placed This Encounter  Procedures   For home use only DME continuous positive airway pressure (CPAP)    Supplies    Order Specific Question:   Length of Need    Answer:   Lifetime    Order Specific Question:   Patient has OSA or probable OSA    Answer:   Yes    Order Specific Question:   Is the patient currently using CPAP in the home    Answer:   Yes    Order Specific Question:   Settings    Answer:   Other see comments    Order Specific Question:   CPAP supplies needed    Answer:   Mask, headgear, cushions, filters, heated tubing and water chamber   Home sleep test    Standing Status:   Future    Standing Expiration Date:   01/07/2024    Order Specific Question:   Where should this test be performed:    Answer:   [facility]     No orders of the defined types were placed in this encounter.    Shawnie Dapper, FNP-C 01/07/2023, 8:27 AM [facility] [address], [address] [place], [place] [address] [phone]

## 2022-12-27 NOTE — Patient Instructions (Addendum)
Please continue using your CPAP regularly. While your insurance requires that you use CPAP at least 4 hours each night on 70% of the nights, I recommend, that you not skip any nights and use it throughout the night if you can. Getting used to CPAP and staying with the treatment long term does take time and patience and discipline. Untreated obstructive sleep apnea when it is moderate to severe can have an adverse impact on cardiovascular health and raise her risk for heart disease, arrhythmias, hypertension, congestive heart failure, stroke and diabetes. Untreated obstructive sleep apnea causes sleep disruption, nonrestorative sleep, and sleep deprivation. This can have an impact on your day to day functioning and cause daytime sleepiness and impairment of cognitive function, memory loss, mood disturbance, and problems focussing. Using CPAP regularly can improve these symptoms.  We will update supply orders, today. You are eligible for a new machine. I will order a repeat sleep study that you will do at home. Please listen out for a call from our sleep lab staff to schedule. Once you complete study, our sleep doctors with evaluate the data and we will use that to order a new machine as indicated. This process could take a few weeks. Once new machine is ordered, you will hear back from your DME company to schedule set up of your new machine.   We will need to see you back within 31-90 days following set up of your new CPAP to document compliance as required by your insurance company. Please call the office to schedule follow up when you receive your new machine. Please feel free to reach out with any questions or concerns.   

## 2022-12-31 IMAGING — CT CT ANGIO CHEST
3 of 8 series · 19 of 46 positions shown · IV contrast (OMNIPAQUE 350)
Comparison: 11/26/2019

CLINICAL DATA: Thoracic aortic aneurysm (WOLFGANG), follow up

EXAM:
CT ANGIOGRAPHY CHEST WITH CONTRAST
TECHNIQUE: Multidetector CT imaging of the chest was performed using the
standard protocol during bolus administration of intravenous
contrast. Multiplanar CT image reconstructions and MIPs were
obtained to evaluate the vascular anatomy.
CONTRAST:  100mL OMNIPAQUE IOHEXOL 350 MG/ML SOLN

[Series 4: aorta 3.0 bf37 2 · axial · 0.79mm/px · z∈[-320,-68]mm · 13 of 98 slices shown]
[im 7/98  lung]
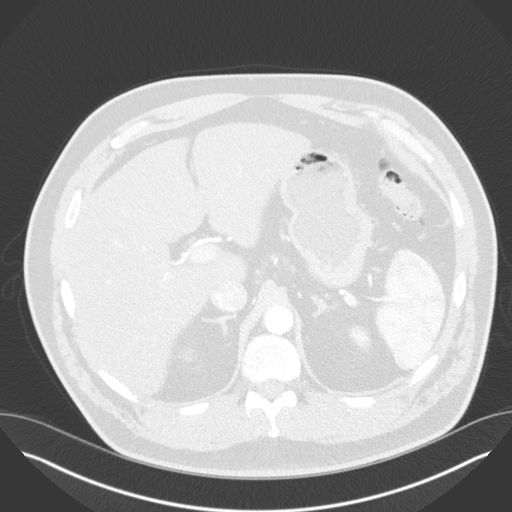
[im 14/98  soft-tissue]
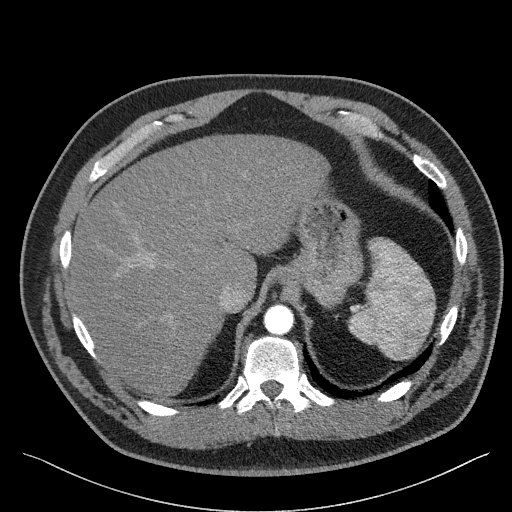
[im 21/98  lung]
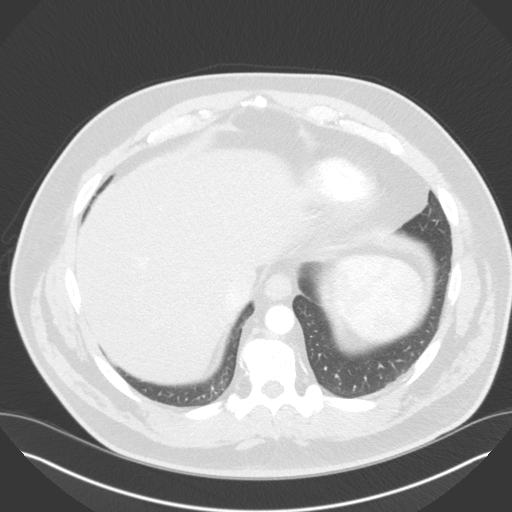
[im 28/98  soft-tissue]
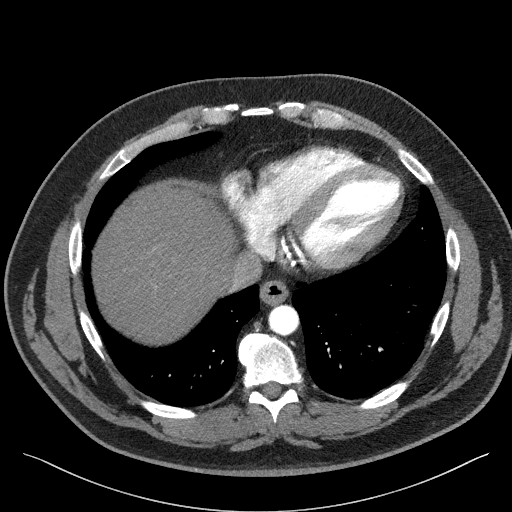
[im 35/98  lung]
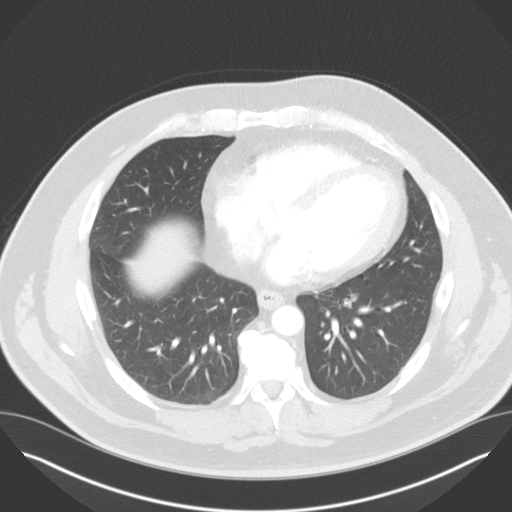
[im 42/98  soft-tissue]
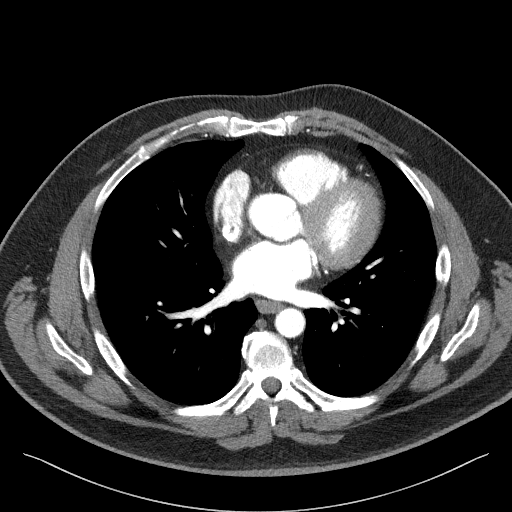
[im 49/98  lung]
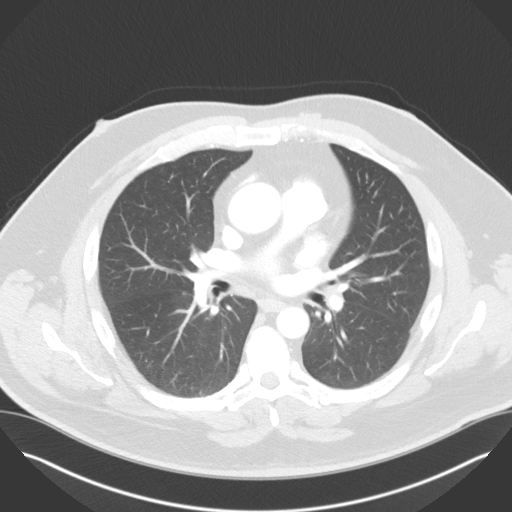
[im 56/98  soft-tissue]
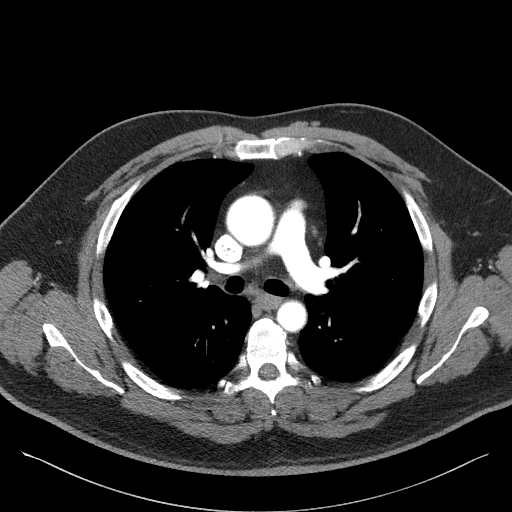
[im 63/98  lung]
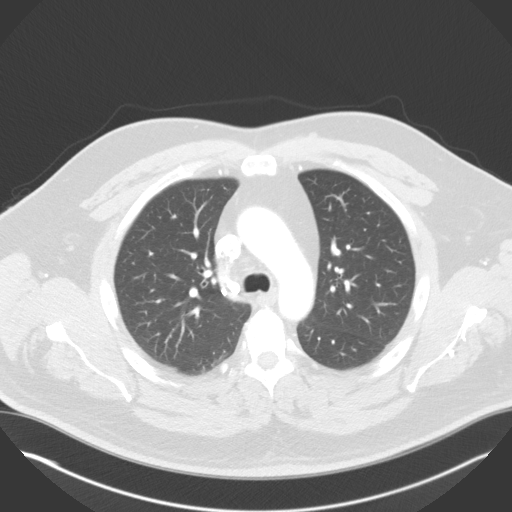
[im 70/98  soft-tissue]
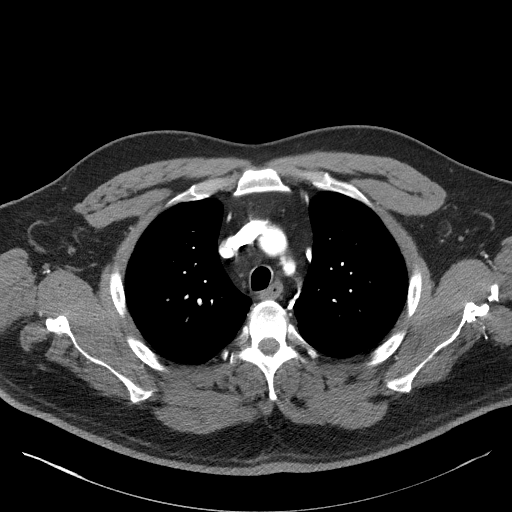
[im 77/98  lung]
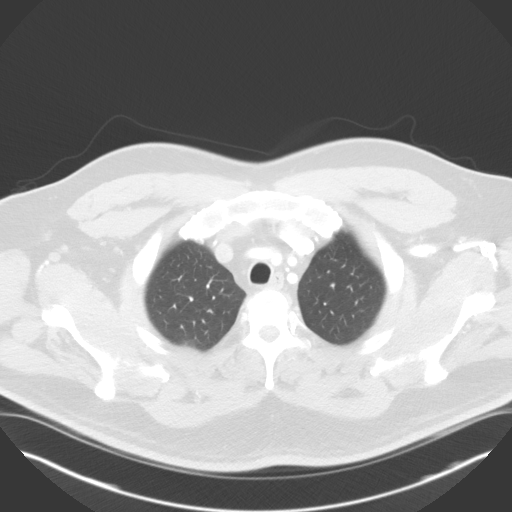
[im 84/98  soft-tissue]
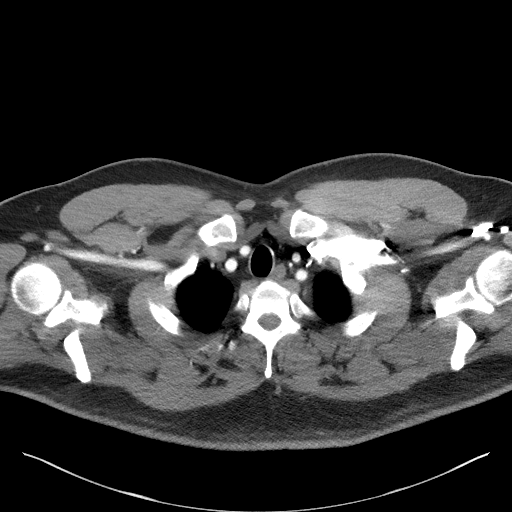
[im 91/98  lung]
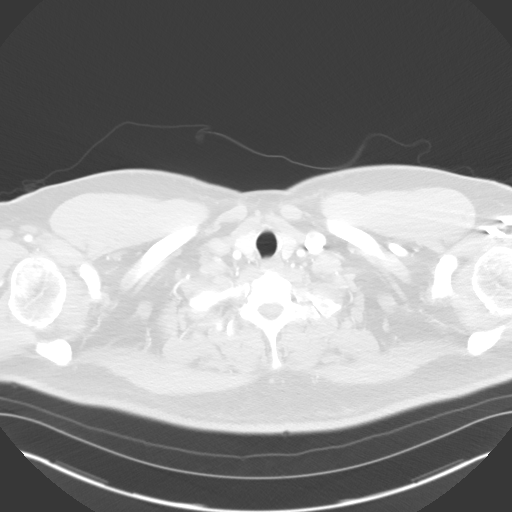

[Series 5: lung · axial · 0.79mm/px · z∈[-320,-236]mm · 3 of 98 slices shown]
[im 7/98  soft-tissue]
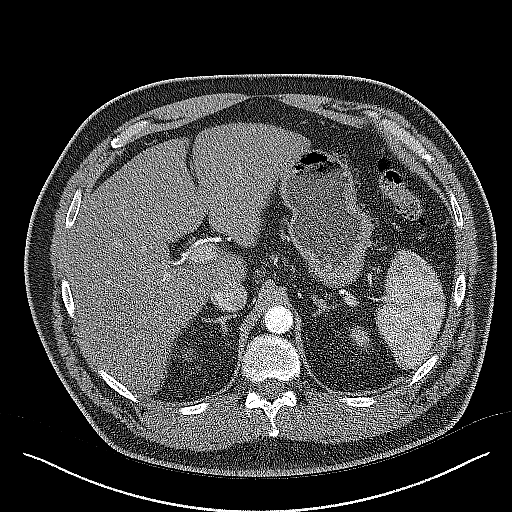
[im 21/98  soft-tissue]
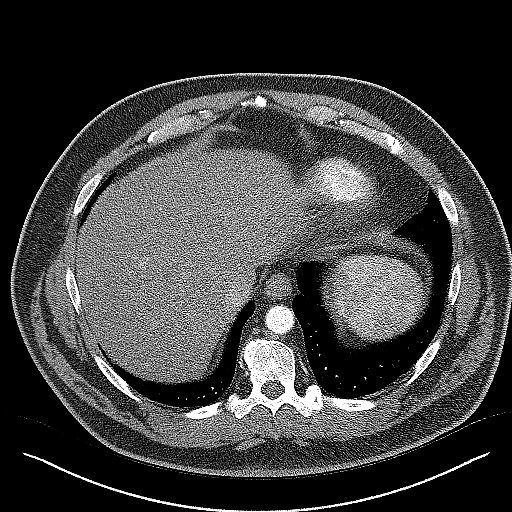
[im 35/98  soft-tissue]
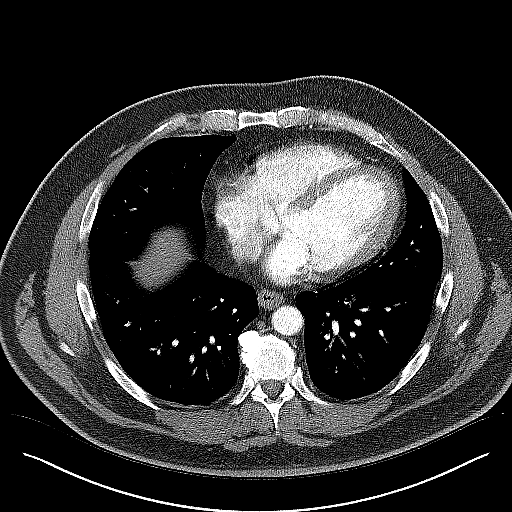

[Series 7: coronals · coronal · 0.59mm/px · 3 of 130 slices shown]
[im 33/130  soft-tissue]
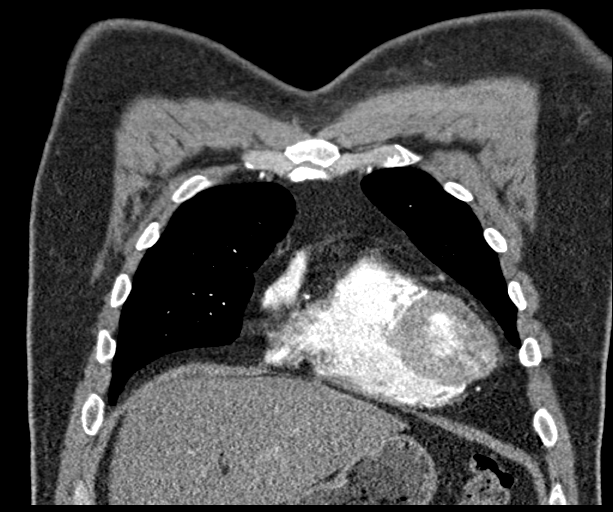
[im 65/130  soft-tissue]
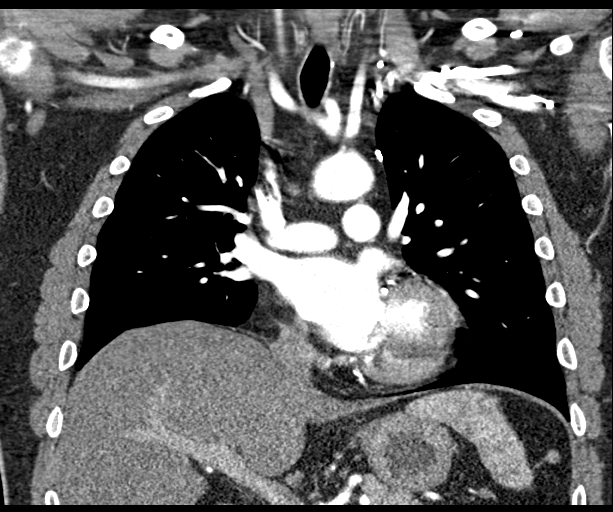
[im 97/130  soft-tissue]
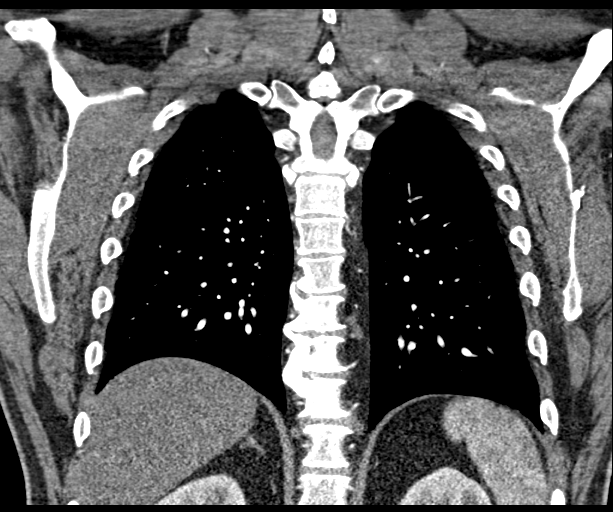

[19 of 46 positions shown; findings below may reference images not displayed]

FINDINGS: Cardiovascular: Maximum aortic diameter is at the aortic root
measures 4 cm, stable. Ascending thoracic aorta maximum diameter
cm. No dissection. Heart is normal size.

Mediastinum/Nodes: No mediastinal, hilar, or axillary adenopathy.
Trachea and esophagus are unremarkable. Thyroid unremarkable.

Lungs/Pleura: Lungs are clear. No focal airspace opacities or
suspicious nodules. No effusions.

Upper Abdomen: Imaging into the upper abdomen demonstrates no acute
findings.

Musculoskeletal: Chest wall soft tissues are unremarkable. No acute
bony abnormality.

Review of the MIP images confirms the above findings.
IMPRESSION: Stable early aneurysmal dilatation of the aortic root at the sinuses
of Valsalva measuring 4 cm.

No acute cardiopulmonary disease.

## 2023-01-07 ENCOUNTER — Ambulatory Visit: Payer: 59 | Admitting: Family Medicine

## 2023-01-07 ENCOUNTER — Encounter: Payer: Self-pay | Admitting: Family Medicine

## 2023-01-07 ENCOUNTER — Ambulatory Visit: Payer: No Typology Code available for payment source | Admitting: Family Medicine

## 2023-01-07 VITALS — BP 126/84 | HR 81 | Ht 68.0 in | Wt 220.0 lb

## 2023-01-07 DIAGNOSIS — G4733 Obstructive sleep apnea (adult) (pediatric): Secondary | ICD-10-CM

## 2023-01-07 DIAGNOSIS — G4731 Primary central sleep apnea: Secondary | ICD-10-CM | POA: Diagnosis not present

## 2023-01-08 ENCOUNTER — Telehealth: Payer: Self-pay | Admitting: Family Medicine

## 2023-01-08 NOTE — Telephone Encounter (Signed)
MAILOUT- cone [employer] no auth req   Patient is on the schedule for 01/23/23.

## 2023-01-23 ENCOUNTER — Ambulatory Visit: Payer: 59 | Admitting: Neurology

## 2023-01-23 DIAGNOSIS — G4731 Primary central sleep apnea: Secondary | ICD-10-CM

## 2023-01-23 DIAGNOSIS — G4733 Obstructive sleep apnea (adult) (pediatric): Secondary | ICD-10-CM

## 2023-01-30 NOTE — Progress Notes (Signed)
[facility] [name], 52 y.o., 08-03-1970 MRN: [id number]   HOME SLEEP TEST REPORT ( MAIL OUT TEST by Watch PAT)   STUDY DATE: 01-30-2023      ORDERING CLINICIAN: Shawnie Dapper, NP   REFERRING CLINICIAN: DOT driver , [place] Martin , MD PCP    CLINICAL INFORMATION/HISTORY: Pt states that he hates his CPAP Machine.  Pt states that his CPAP Machine is a hassle when he is traveling.   01/07/23 ALL: Marcus Dickson returns for follow up for OSA on CPAP. He reports doing fairly well but has difficulty meeting compliance at times. He continues to travel. He does not like to pack up his machine. He feels that he struggles with air feeling too hot or too cold. He no longer snores. He does not feel as sleepy during the day. He does note medical benefits of using CPAP. He is eligible for a new machine and would like to consider getting a travel machine.    Epworth sleepiness score:  2/24.  FSS not stated    BMI:  33.45 kg/m   Neck Circumference: 19.5"   FINDINGS:   Sleep Summary:   Total Recording Time (hours, min):    8 hours 0 minutes   Total Sleep Time (hours, min):   7 hours 30 minutes              Percent REM (%):    32%                                    Respiratory Indices by AASM criteria:   Calculated pAHI (per hour):     15.9/h                        REM pAHI:    25.9/h                                             NREM pAHI:    11.2/h                          Positional AHI:   The patient slept equal amounts of time in supine and right lateral position.  His AHI in supine position was 23.4/h and right-sided lateral position 10.1/h on the left side 12.9/h and in prone sleep 15.6/h.  So overall there is a benefit of avoiding supine sleep.  Snoring which to mean volume of 40 dB which is the threshold for this device.  There were only 2.5 minutes of sleep associated with snoring.                                                Oxygen Saturation Statistics:   O2  Saturation Range (%):    Between a nadir at 85% and a maximum saturation of 98% there was a mean saturation of 94%.                                   O2 Saturation (  minutes) <89%: 0.2 minutes         Pulse Rate Statistics:   Pulse Mean (bpm):      72 bpm           Pulse Range:    Between a range of 60 to 92 bpm             IMPRESSION:  This HST confirms the presence of mild obstructive and REM sleep dependent apnea which should be treated with CPAP.  There is no alternative treatment of a REM dependent sleep apnea with an inspire device.     RECOMMENDATION:The patient is on the right way to reduce his apnea by pursuing weight loss and exercising especially core strength exercises do help with sleep or breathing.  Until this has been reached the patient will rely on CPAP.  An auto titration device with a setting from 5-15 cmH2O will be issued-With 3 cm expiratory relief, heated humidification and an interface of patient's choice, which was not mentioned in his last visit note.  His residual AHI was 1.0     INTERPRETING PHYSICIAN:   Melvyn Novas, MD

## 2023-02-03 NOTE — Procedures (Signed)
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  Pt states that his CPAP Machine is a hassle when he is traveling.   01/07/23 ALL: Cutler returns for follow up for OSA on CPAP. He reports doing fairly well but has difficulty meeting compliance at times. He continues to travel. He does not like to pack up his machine. He feels that he struggles with air feeling too hot or too cold. He no longer snores. He does not feel as sleepy during the day. He does note medical benefits of using CPAP. He is eligible for a new machine and would like to consider getting a travel machine.    Epworth sleepiness score:  2/24.  FSS not stated    BMI:  33.45 kg/m   Neck Circumference: 19.5"   FINDINGS:   Sleep Summary:   Total Recording Time (hours, min):    8 hours 0 minutes   Total Sleep Time (hours, min):   7 hours 30 minutes              Percent REM (%):    32%                                    Respiratory Indices by AASM criteria:   Calculated pAHI (per hour):     15.9/h                        REM pAHI:    25.9/h                                             NREM pAHI:    11.2/h                          Positional AHI:   The patient slept equal amounts of time in supine and right lateral position.  His AHI in supine position was 23.4/h and right-sided lateral position 10.1/h on the left side 12.9/h and in prone sleep 15.6/h.  So overall there is a benefit of avoiding supine sleep.  Snoring which to mean volume of 40 dB which is the threshold for this device.  There were only 2.5 minutes of sleep associated with snoring.                                                Oxygen Saturation Statistics:   O2 Saturation Range  (%):    Between a nadir at 85% and a maximum saturation of 98% there was a mean saturation of 94%.                                   O2 Saturation (minutes) <89%: 0.2 minutes  Pulse Rate Statistics:   Pulse Mean (bpm):      72 bpm           Pulse Range:    Between a range of 60 to 92 bpm             IMPRESSION:  This HST confirms the presence of mild obstructive and REM sleep dependent apnea which should be treated with CPAP.  There is no alternative treatment of a REM dependent sleep apnea with an inspire device.     RECOMMENDATION:The patient is on the right way to reduce his apnea by pursuing weight loss and exercising especially core strength exercises do help with sleep or breathing.  Until this has been reached the patient will rely on CPAP.  An auto titration device with a setting from 5-15 cmH2O will be issued-With 3 cm expiratory relief, heated humidification and an interface of patient's choice, which was not mentioned in his last visit note.  His residual AHI was 1.0     INTERPRETING PHYSICIAN:   Melvyn Novas, MD

## 2023-02-04 ENCOUNTER — Other Ambulatory Visit: Payer: Self-pay | Admitting: Family Medicine

## 2023-02-04 DIAGNOSIS — G4733 Obstructive sleep apnea (adult) (pediatric): Secondary | ICD-10-CM

## 2023-02-14 ENCOUNTER — Other Ambulatory Visit (HOSPITAL_COMMUNITY): Payer: Self-pay

## 2023-02-14 ENCOUNTER — Other Ambulatory Visit: Payer: Self-pay | Admitting: Cardiology

## 2023-02-14 ENCOUNTER — Other Ambulatory Visit: Payer: Self-pay

## 2023-02-14 DIAGNOSIS — I10 Essential (primary) hypertension: Secondary | ICD-10-CM

## 2023-02-14 MED ORDER — SYNJARDY XR 25-1000 MG PO TB24
1.0000 | ORAL_TABLET | Freq: Every day | ORAL | 3 refills | Status: DC
  Filled 2023-02-14: qty 90, 90d supply, fill #0
  Filled 2023-05-12: qty 90, 90d supply, fill #1
  Filled 2023-08-11 – 2023-08-14 (×2): qty 30, 30d supply, fill #2

## 2023-02-14 MED ORDER — LOSARTAN POTASSIUM 50 MG PO TABS
50.0000 mg | ORAL_TABLET | Freq: Every day | ORAL | 2 refills | Status: DC
  Filled 2023-02-14 (×2): qty 90, 90d supply, fill #0
  Filled 2023-05-12: qty 90, 90d supply, fill #1
  Filled 2023-08-11 – 2023-08-23 (×2): qty 30, 30d supply, fill #2
  Filled 2023-09-23: qty 30, 30d supply, fill #3
  Filled 2023-10-16: qty 30, 30d supply, fill #4

## 2023-02-15 ENCOUNTER — Other Ambulatory Visit (HOSPITAL_COMMUNITY): Payer: Self-pay

## 2023-02-15 DIAGNOSIS — I2584 Coronary atherosclerosis due to calcified coronary lesion: Secondary | ICD-10-CM | POA: Diagnosis not present

## 2023-02-15 DIAGNOSIS — G4733 Obstructive sleep apnea (adult) (pediatric): Secondary | ICD-10-CM | POA: Diagnosis not present

## 2023-02-15 DIAGNOSIS — I700 Atherosclerosis of aorta: Secondary | ICD-10-CM | POA: Diagnosis not present

## 2023-02-15 DIAGNOSIS — K760 Fatty (change of) liver, not elsewhere classified: Secondary | ICD-10-CM | POA: Diagnosis not present

## 2023-02-15 DIAGNOSIS — E1159 Type 2 diabetes mellitus with other circulatory complications: Secondary | ICD-10-CM | POA: Diagnosis not present

## 2023-02-15 DIAGNOSIS — E669 Obesity, unspecified: Secondary | ICD-10-CM | POA: Diagnosis not present

## 2023-02-15 DIAGNOSIS — I7121 Aneurysm of the ascending aorta, without rupture: Secondary | ICD-10-CM | POA: Diagnosis not present

## 2023-02-15 DIAGNOSIS — E785 Hyperlipidemia, unspecified: Secondary | ICD-10-CM | POA: Diagnosis not present

## 2023-02-15 DIAGNOSIS — I2510 Atherosclerotic heart disease of native coronary artery without angina pectoris: Secondary | ICD-10-CM | POA: Diagnosis not present

## 2023-02-18 ENCOUNTER — Other Ambulatory Visit (HOSPITAL_COMMUNITY): Payer: Self-pay

## 2023-03-22 ENCOUNTER — Other Ambulatory Visit (HOSPITAL_COMMUNITY): Payer: Self-pay

## 2023-03-25 DIAGNOSIS — G4733 Obstructive sleep apnea (adult) (pediatric): Secondary | ICD-10-CM | POA: Diagnosis not present

## 2023-04-14 ENCOUNTER — Ambulatory Visit (HOSPITAL_BASED_OUTPATIENT_CLINIC_OR_DEPARTMENT_OTHER)
Admission: RE | Admit: 2023-04-14 | Discharge: 2023-04-14 | Disposition: A | Discharge status: Home / Self Care | Payer: 59 | Source: Ambulatory Visit | Attending: Cardiology | Admitting: Cardiology

## 2023-04-14 DIAGNOSIS — I2510 Atherosclerotic heart disease of native coronary artery without angina pectoris: Secondary | ICD-10-CM | POA: Diagnosis not present

## 2023-04-14 DIAGNOSIS — I7121 Aneurysm of the ascending aorta, without rupture: Secondary | ICD-10-CM | POA: Diagnosis not present

## 2023-04-14 DIAGNOSIS — I712 Thoracic aortic aneurysm, without rupture, unspecified: Secondary | ICD-10-CM | POA: Diagnosis not present

## 2023-04-14 MED ORDER — IOHEXOL 350 MG/ML SOLN
100.0000 mL | Freq: Once | INTRAVENOUS | Status: AC | PRN
  Administered 2023-04-14: 100 mL via INTRAVENOUS

## 2023-04-25 DIAGNOSIS — G4733 Obstructive sleep apnea (adult) (pediatric): Secondary | ICD-10-CM | POA: Diagnosis not present

## 2023-04-25 LAB — POCT I-STAT CREATININE: Creatinine, Ser: 0.7 mg/dL (ref 0.61–1.24)

## 2023-04-26 ENCOUNTER — Encounter: Payer: Self-pay | Admitting: *Deleted

## 2023-05-13 ENCOUNTER — Other Ambulatory Visit (HOSPITAL_COMMUNITY): Payer: Self-pay

## 2023-05-13 ENCOUNTER — Other Ambulatory Visit: Payer: Self-pay

## 2023-05-14 ENCOUNTER — Other Ambulatory Visit (HOSPITAL_COMMUNITY): Payer: Self-pay

## 2023-05-16 ENCOUNTER — Other Ambulatory Visit (HOSPITAL_COMMUNITY): Payer: Self-pay

## 2023-05-16 MED ORDER — ZOSTER VAC RECOMB ADJUVANTED 50 MCG/0.5ML IM SUSR
0.5000 mL | Freq: Once | INTRAMUSCULAR | 0 refills | Status: AC
  Filled 2023-05-16: qty 0.5, 1d supply, fill #0

## 2023-05-25 DIAGNOSIS — G4733 Obstructive sleep apnea (adult) (pediatric): Secondary | ICD-10-CM | POA: Diagnosis not present

## 2023-05-27 ENCOUNTER — Other Ambulatory Visit (HOSPITAL_BASED_OUTPATIENT_CLINIC_OR_DEPARTMENT_OTHER): Payer: 59

## 2023-06-14 DIAGNOSIS — Z125 Encounter for screening for malignant neoplasm of prostate: Secondary | ICD-10-CM | POA: Diagnosis not present

## 2023-06-14 DIAGNOSIS — I2510 Atherosclerotic heart disease of native coronary artery without angina pectoris: Secondary | ICD-10-CM | POA: Diagnosis not present

## 2023-06-14 DIAGNOSIS — E1159 Type 2 diabetes mellitus with other circulatory complications: Secondary | ICD-10-CM | POA: Diagnosis not present

## 2023-06-14 DIAGNOSIS — E785 Hyperlipidemia, unspecified: Secondary | ICD-10-CM | POA: Diagnosis not present

## 2023-06-14 DIAGNOSIS — E669 Obesity, unspecified: Secondary | ICD-10-CM | POA: Diagnosis not present

## 2023-06-21 DIAGNOSIS — G4733 Obstructive sleep apnea (adult) (pediatric): Secondary | ICD-10-CM | POA: Diagnosis not present

## 2023-06-21 DIAGNOSIS — I7121 Aneurysm of the ascending aorta, without rupture: Secondary | ICD-10-CM | POA: Diagnosis not present

## 2023-06-21 DIAGNOSIS — I2584 Coronary atherosclerosis due to calcified coronary lesion: Secondary | ICD-10-CM | POA: Diagnosis not present

## 2023-06-21 DIAGNOSIS — E669 Obesity, unspecified: Secondary | ICD-10-CM | POA: Diagnosis not present

## 2023-06-21 DIAGNOSIS — I700 Atherosclerosis of aorta: Secondary | ICD-10-CM | POA: Diagnosis not present

## 2023-06-21 DIAGNOSIS — Z0000 Encounter for general adult medical examination without abnormal findings: Secondary | ICD-10-CM | POA: Diagnosis not present

## 2023-06-21 DIAGNOSIS — E1159 Type 2 diabetes mellitus with other circulatory complications: Secondary | ICD-10-CM | POA: Diagnosis not present

## 2023-06-21 DIAGNOSIS — E785 Hyperlipidemia, unspecified: Secondary | ICD-10-CM | POA: Diagnosis not present

## 2023-06-21 DIAGNOSIS — K760 Fatty (change of) liver, not elsewhere classified: Secondary | ICD-10-CM | POA: Diagnosis not present

## 2023-06-21 DIAGNOSIS — I2510 Atherosclerotic heart disease of native coronary artery without angina pectoris: Secondary | ICD-10-CM | POA: Diagnosis not present

## 2023-06-25 DIAGNOSIS — G4733 Obstructive sleep apnea (adult) (pediatric): Secondary | ICD-10-CM | POA: Diagnosis not present

## 2023-07-19 ENCOUNTER — Encounter (INDEPENDENT_AMBULATORY_CARE_PROVIDER_SITE_OTHER): Payer: 59 | Admitting: Ophthalmology

## 2023-07-19 ENCOUNTER — Encounter (INDEPENDENT_AMBULATORY_CARE_PROVIDER_SITE_OTHER): Payer: Self-pay

## 2023-07-25 DIAGNOSIS — G4733 Obstructive sleep apnea (adult) (pediatric): Secondary | ICD-10-CM | POA: Diagnosis not present

## 2023-08-01 ENCOUNTER — Other Ambulatory Visit (HOSPITAL_COMMUNITY): Payer: Self-pay

## 2023-08-01 MED ORDER — SHINGRIX 50 MCG/0.5ML IM SUSR
0.5000 mL | INTRAMUSCULAR | 0 refills | Status: AC
  Filled 2023-08-01: qty 0.5, 1d supply, fill #0

## 2023-08-12 ENCOUNTER — Other Ambulatory Visit: Payer: Self-pay

## 2023-08-12 ENCOUNTER — Other Ambulatory Visit (HOSPITAL_COMMUNITY): Payer: Self-pay

## 2023-08-14 ENCOUNTER — Encounter (HOSPITAL_COMMUNITY): Payer: Self-pay

## 2023-08-14 ENCOUNTER — Other Ambulatory Visit (HOSPITAL_COMMUNITY): Payer: Self-pay

## 2023-08-15 ENCOUNTER — Other Ambulatory Visit (HOSPITAL_COMMUNITY): Payer: Self-pay

## 2023-08-15 MED ORDER — PRALUENT 150 MG/ML ~~LOC~~ SOAJ
150.0000 mg | SUBCUTANEOUS | 3 refills | Status: DC
  Filled 2023-08-15 – 2023-08-23 (×2): qty 2, 28d supply, fill #0

## 2023-08-15 MED ORDER — SYNJARDY XR 25-1000 MG PO TB24
1.0000 | ORAL_TABLET | Freq: Every day | ORAL | 3 refills | Status: AC
  Filled 2023-08-15 – 2023-08-28 (×4): qty 30, 30d supply, fill #0
  Filled 2023-09-23: qty 30, 30d supply, fill #1
  Filled 2023-10-16: qty 30, 30d supply, fill #2

## 2023-08-15 MED ORDER — OZEMPIC (1 MG/DOSE) 4 MG/3ML ~~LOC~~ SOPN
1.0000 mg | PEN_INJECTOR | SUBCUTANEOUS | 3 refills | Status: AC
  Filled 2023-08-15 – 2023-08-28 (×4): qty 3, 28d supply, fill #0
  Filled 2023-09-16 – 2023-09-19 (×3): qty 3, 28d supply, fill #1
  Filled 2023-10-16 – 2023-10-17 (×2): qty 3, 28d supply, fill #2

## 2023-08-15 MED ORDER — ICOSAPENT ETHYL 1 G PO CAPS
2.0000 g | ORAL_CAPSULE | Freq: Two times a day (BID) | ORAL | 3 refills | Status: AC
  Filled 2023-08-15: qty 120, 30d supply, fill #0
  Filled 2023-08-16: qty 360, 90d supply, fill #0
  Filled 2023-08-23 – 2023-09-16 (×3): qty 120, 30d supply, fill #0
  Filled 2023-10-16: qty 120, 30d supply, fill #1

## 2023-08-16 ENCOUNTER — Other Ambulatory Visit (HOSPITAL_COMMUNITY): Payer: Self-pay

## 2023-08-22 ENCOUNTER — Other Ambulatory Visit (HOSPITAL_COMMUNITY): Payer: Self-pay

## 2023-08-23 ENCOUNTER — Other Ambulatory Visit (HOSPITAL_COMMUNITY): Payer: Self-pay

## 2023-08-25 DIAGNOSIS — G4733 Obstructive sleep apnea (adult) (pediatric): Secondary | ICD-10-CM | POA: Diagnosis not present

## 2023-08-26 ENCOUNTER — Other Ambulatory Visit (HOSPITAL_COMMUNITY): Payer: Self-pay

## 2023-08-27 ENCOUNTER — Other Ambulatory Visit (HOSPITAL_COMMUNITY): Payer: Self-pay

## 2023-08-27 MED ORDER — REPATHA SURECLICK 140 MG/ML ~~LOC~~ SOAJ
140.0000 mg | SUBCUTANEOUS | 5 refills | Status: DC
  Filled 2023-08-27: qty 2, 28d supply, fill #0
  Filled 2023-09-17 – 2023-09-19 (×2): qty 2, 28d supply, fill #1
  Filled 2023-10-16: qty 2, 28d supply, fill #2
  Filled 2023-11-13: qty 2, 28d supply, fill #3
  Filled 2023-12-20: qty 2, 28d supply, fill #4
  Filled 2024-01-16: qty 2, 28d supply, fill #5

## 2023-08-28 ENCOUNTER — Other Ambulatory Visit (HOSPITAL_COMMUNITY): Payer: Self-pay

## 2023-08-29 ENCOUNTER — Other Ambulatory Visit (HOSPITAL_COMMUNITY): Payer: Self-pay

## 2023-09-16 ENCOUNTER — Other Ambulatory Visit (HOSPITAL_COMMUNITY): Payer: Self-pay

## 2023-09-16 ENCOUNTER — Other Ambulatory Visit: Payer: Self-pay

## 2023-09-17 ENCOUNTER — Other Ambulatory Visit (HOSPITAL_COMMUNITY): Payer: Self-pay

## 2023-09-19 ENCOUNTER — Other Ambulatory Visit (HOSPITAL_COMMUNITY): Payer: Self-pay

## 2023-09-25 DIAGNOSIS — G4733 Obstructive sleep apnea (adult) (pediatric): Secondary | ICD-10-CM | POA: Diagnosis not present

## 2023-09-26 ENCOUNTER — Other Ambulatory Visit (HOSPITAL_COMMUNITY): Payer: Self-pay

## 2023-10-16 ENCOUNTER — Other Ambulatory Visit (HOSPITAL_COMMUNITY): Payer: Self-pay

## 2023-10-17 ENCOUNTER — Other Ambulatory Visit (HOSPITAL_COMMUNITY): Payer: Self-pay

## 2023-10-17 ENCOUNTER — Other Ambulatory Visit: Payer: Self-pay

## 2023-10-22 ENCOUNTER — Other Ambulatory Visit (HOSPITAL_COMMUNITY): Payer: Self-pay

## 2023-10-23 DIAGNOSIS — G4733 Obstructive sleep apnea (adult) (pediatric): Secondary | ICD-10-CM | POA: Diagnosis not present

## 2023-10-31 NOTE — Progress Notes (Unsigned)
 Cardiology Office Note:   Date:  11/01/2023  ID:  Marcus Dickson, DOB 1971/02/03, MRN [id number] PCP: Adrian Prince, MD  [facility] Providers Cardiologist:  Rollene Rotunda, MD {  History of Present Illness:   Marcus Dickson is a 53 y.o. male who presents for a history of coronary calcium.  We found this on screening because he has a very strong family history of early onset coronary artery disease. He had a negative POET (Plain Old Exercise Treadmill) last in Feb of 2019.  He had dyspnea.  I sent him for coronary CTA.  He had non obstructive disease as below.  He does have a mildly enlarged aortic root.     He had a follow up negative POET (Plain Old Exercise Treadmill).  However, he did only get his heart rate to 82% of predicted was limited somewhat by shortness of breath.  He had a negative stress echo.  However, we managed him medically without further testing.   He had a CT of his chest which demonstrated 4 cm stable sinus of Valsalva dilatation.     Since I last saw him he has been doing very well.  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 The patient denies any new symptoms such as chest discomfort, neck or arm discomfort. There has been no new shortness of breath, PND or orthopnea. There have been no reported palpitations, presyncope or syncope.  He is eating well.  He has had no cardiovascular symptoms.   ROS: As stated in the HPI and negative for all other systems.  Studies Reviewed:    EKG:   EKG Interpretation Date/Time:  Friday November 01 2023 15:12:56 EDT Ventricular Rate:  77 PR Interval:  188 QRS Duration:  96 QT Interval:  372 QTC Calculation: 420 R Axis:   128  Text Interpretation: Normal sinus rhythm Right axis deviation poor anterior R wave progression No significant change since last tracing Confirmed by Rollene Rotunda ([id number]) on 11/01/2023 3:20:10 PM    Risk Assessment/Calculations:              Physical Exam:   VS:  BP 116/88 (BP  Location: Left Arm, Patient Position: Sitting, Cuff Size: Normal)   Pulse 83   Ht 5\' 8"  (1.727 m)   Wt 222 lb 6.4 oz (100.9 kg)   SpO2 94%   BMI 33.82 kg/m    Wt Readings from Last 3 Encounters:  11/01/23 222 lb 6.4 oz (100.9 kg)  01/07/23 220 lb (99.8 kg)  10/12/22 223 lb 9.6 oz (101.4 kg)     GEN: Well nourished, well developed in no acute distress NECK: No JVD; No carotid bruits CARDIAC: RRR, no murmurs, rubs, gallops RESPIRATORY:  Clear to auscultation without rales, wheezing or rhonchi  ABDOMEN: Soft, non-tender, non-distended EXTREMITIES:  No edema; No deformity   ASSESSMENT AND PLAN:   CAD:  The patient has no new sypmtoms.  No further cardiovascular testing is indicated.  We will continue with aggressive risk reduction and meds as listed .  I again reviewed with the patient and his wife the importance of symptoms and the anatomy that he has.  They understand the need for aggressive primary risk reduction.   ENLARGED AORTA:   This was 4 cm.  I will probably repeat a CT in about 2 years.   DM:   A1c was 5.9.  I will defer to his primary provider.   DYSLIPIDEMIA:  His LDL was 38.  Continue meds as listed.  I will check an LP(a).  SLEEP APNEA:    He just got a new mask and is compliant.   HTN:   The blood pressure is at target.  No change in therapy.    WEIGHT:   He did lose some weight and this is stable to off.  He thought he was losing too much weight.  We talked about diet and exercise again.    Follow up with me in 1 year.  Signed, Rollene Rotunda, MD

## 2023-11-01 ENCOUNTER — Encounter: Payer: Self-pay | Admitting: Cardiology

## 2023-11-01 ENCOUNTER — Ambulatory Visit: Payer: Self-pay | Attending: Cardiology | Admitting: Cardiology

## 2023-11-01 VITALS — BP 116/88 | HR 83 | Ht 68.0 in | Wt 222.4 lb

## 2023-11-01 DIAGNOSIS — I2510 Atherosclerotic heart disease of native coronary artery without angina pectoris: Secondary | ICD-10-CM

## 2023-11-01 DIAGNOSIS — E118 Type 2 diabetes mellitus with unspecified complications: Secondary | ICD-10-CM | POA: Diagnosis not present

## 2023-11-01 DIAGNOSIS — I7121 Aneurysm of the ascending aorta, without rupture: Secondary | ICD-10-CM

## 2023-11-01 DIAGNOSIS — E785 Hyperlipidemia, unspecified: Secondary | ICD-10-CM | POA: Diagnosis not present

## 2023-11-01 DIAGNOSIS — I10 Essential (primary) hypertension: Secondary | ICD-10-CM | POA: Diagnosis not present

## 2023-11-01 NOTE — Patient Instructions (Signed)
 Medication Instructions:  No changes.   *If you need a refill on your cardiac medications before your next appointment, please call your pharmacy*  Lab Work: LPA today. If you have labs (blood work) drawn today and your tests are completely normal, you will receive your results only by: MyChart Message (if you have MyChart) OR A paper copy in the mail If you have any lab test that is abnormal or we need to change your treatment, we will call you to review the results.    Follow-Up: At [facility], you and your health needs are our priority.  As part of our continuing mission to provide you with exceptional heart care, our providers are all part of one team.  This team includes your primary Cardiologist (physician) and Advanced Practice Providers or APPs (Physician Assistants and Nurse Practitioners) who all work together to provide you with the care you need, when you need it.  Your next appointment:   1 year(s)  Provider:   Rollene Rotunda, MD     We recommend signing up for the patient portal called "MyChart".  Sign up information is provided on this After Visit Summary.  MyChart is used to connect with patients for Virtual Visits (Telemedicine).  Patients are able to view lab/test results, encounter notes, upcoming appointments, etc.  Non-urgent messages can be sent to your provider as well.   To learn more about what you can do with MyChart, go to [email or web address].   Other Instructions       1st Floor: - Lobby - Registration  - Pharmacy  - Lab - Cafe  2nd Floor: - PV Lab - Diagnostic Testing (echo, CT, nuclear med)  3rd Floor: - Vacant  4th Floor: - TCTS (cardiothoracic surgery) - AFib Clinic - Structural Heart Clinic - Vascular Surgery  - Vascular Ultrasound  5th Floor: - HeartCare Cardiology (general and EP) - Clinical Pharmacy for coumadin, hypertension, lipid, weight-loss medications, and med management appointments    Valet parking  services will be available as well.

## 2023-11-04 LAB — LIPOPROTEIN A (LPA): Lipoprotein (a): <8.4 nmol/L (ref <75.0)

## 2023-11-06 ENCOUNTER — Encounter: Payer: Self-pay | Admitting: Cardiology

## 2023-11-07 ENCOUNTER — Telehealth: Payer: Self-pay | Admitting: Cardiology

## 2023-11-07 DIAGNOSIS — I10 Essential (primary) hypertension: Secondary | ICD-10-CM

## 2023-11-07 MED ORDER — ATENOLOL 50 MG PO TABS: ORAL_TABLET | ORAL | 3 refills | Status: AC

## 2023-11-07 MED ORDER — LOSARTAN POTASSIUM 50 MG PO TABS: 50.0000 mg | ORAL_TABLET | Freq: Every day | ORAL | 3 refills | Status: AC

## 2023-11-07 NOTE — Telephone Encounter (Signed)
*  STAT* If patient is at the pharmacy, call can be transferred to refill team.   1. Which medications need to be refilled? (please list name of each medication and dose if known) atenolol (TENORMIN) 50 MG tablet  losartan (COZAAR) 50 MG tablet    2. Which pharmacy/location (including street and city if local pharmacy) is medication to be sent to?  [facility] - [place], [place] - [address]      3. Do they need a 30 day or 90 day supply? 90 day

## 2023-11-07 NOTE — Telephone Encounter (Signed)
 Pt's medications were sent to pt's pharmacy as requested. Confirmation received.

## 2023-11-14 ENCOUNTER — Other Ambulatory Visit (HOSPITAL_COMMUNITY): Payer: Self-pay

## 2023-11-23 DIAGNOSIS — G4733 Obstructive sleep apnea (adult) (pediatric): Secondary | ICD-10-CM | POA: Diagnosis not present

## 2023-12-02 DIAGNOSIS — E1159 Type 2 diabetes mellitus with other circulatory complications: Secondary | ICD-10-CM | POA: Diagnosis not present

## 2023-12-03 ENCOUNTER — Other Ambulatory Visit: Payer: Self-pay | Admitting: Endocrinology

## 2023-12-03 DIAGNOSIS — R7989 Other specified abnormal findings of blood chemistry: Secondary | ICD-10-CM

## 2023-12-23 DIAGNOSIS — G4733 Obstructive sleep apnea (adult) (pediatric): Secondary | ICD-10-CM | POA: Diagnosis not present

## 2024-02-17 ENCOUNTER — Other Ambulatory Visit (HOSPITAL_COMMUNITY): Payer: Self-pay

## 2024-02-17 MED ORDER — REPATHA SURECLICK 140 MG/ML ~~LOC~~ SOAJ
140.0000 mg | SUBCUTANEOUS | 5 refills | Status: AC
  Filled 2024-02-17 – 2024-06-09 (×2): qty 2, 28d supply, fill #0

## 2024-02-17 MED ORDER — REPATHA SURECLICK 140 MG/ML ~~LOC~~ SOAJ
140.0000 mg | SUBCUTANEOUS | 5 refills | Status: AC
  Filled 2024-02-17: qty 2, 28d supply, fill #0
  Filled 2024-03-18 – 2024-03-21 (×2): qty 2, 28d supply, fill #1
  Filled 2024-04-15: qty 2, 28d supply, fill #2
  Filled 2024-05-07: qty 2, 28d supply, fill #3

## 2024-02-18 ENCOUNTER — Other Ambulatory Visit (HOSPITAL_COMMUNITY): Payer: Self-pay

## 2024-02-20 ENCOUNTER — Other Ambulatory Visit (HOSPITAL_COMMUNITY): Payer: Self-pay

## 2024-02-22 DIAGNOSIS — G4733 Obstructive sleep apnea (adult) (pediatric): Secondary | ICD-10-CM | POA: Diagnosis not present

## 2024-03-18 ENCOUNTER — Other Ambulatory Visit: Payer: Self-pay

## 2024-03-18 ENCOUNTER — Other Ambulatory Visit (HOSPITAL_COMMUNITY): Payer: Self-pay

## 2024-03-21 ENCOUNTER — Other Ambulatory Visit (HOSPITAL_COMMUNITY): Payer: Self-pay

## 2024-03-23 ENCOUNTER — Other Ambulatory Visit (HOSPITAL_COMMUNITY): Payer: Self-pay

## 2024-05-26 ENCOUNTER — Telehealth: Payer: Self-pay | Admitting: Family Medicine

## 2024-05-26 NOTE — Telephone Encounter (Signed)
 Pt was called back, the response from RN was relayed. Pt accepted the next available appointment with Dr Chalice, pt declined wait list because of him traveling so much on his job, pt advised to being CPAP and power cord to appointment.

## 2024-05-26 NOTE — Telephone Encounter (Signed)
 Phone room: pt has not been seen in over a year. He will need updated visit but to address request he called about, he will need to speak with [employer] who provided the machine. Their phone number is: [phone]

## 2024-05-26 NOTE — Telephone Encounter (Signed)
 Phone room: I understand what the pt is saying but the DME company/[employer] ([employer]) is who gets approval through the insurnace.   Looks like he was set up with new machine 03/25/23. He was supposed to have a follow up 31-90 days after this visit for insurance to continue to cover. Does not look like this occurred.    He needs to schedule an appointment to have an updated visit at this point. Please schedule.

## 2024-05-26 NOTE — Telephone Encounter (Signed)
 Pt stating that the claim for Cpap was denied by insurance company . Insurance company is stating that Pt can't have 2 cpap machine  . Pt insurance company is requesting a letter stating that Pt is getting a replacement Cpap machine not a second Machine

## 2024-05-26 NOTE — Telephone Encounter (Signed)
 Phone rep called pt and relayed message from RN.  Pt states that when he last saw Amy, NP he was told at that time that he was eligible for a new replacement CPAP.  Pt states that is what his insurance company is needing, to know that regardless of him not being seen in over a year when he was last seen it was said he qualified then for a replacement, he is not pursuing a second machine.  Pt is asking the RN review this request again.

## 2024-06-01 NOTE — Progress Notes (Unsigned)
 PATIENT: Marcus Dickson DOB: 1971-04-14  REASON FOR VISIT: follow up HISTORY FROM: patient  Virtual Visit via MyChart video  I connected with Marcus Dickson on 06/03/24 at  7:45 AM EST via MyChart video and verified that I am speaking with the correct person using two identifiers.   I discussed the limitations, risks, security and privacy concerns of performing an evaluation and management service by Mychart video and the availability of in person appointments. I also discussed with the patient that there may be a patient responsible charge related to this service. The patient expressed understanding and agreed to proceed.   History of Present Illness:  06/03/24 ALL (MyChart): Marcus Dickson is a 53 y.o. male here today for follow up for OSA on CPAP. He does not like using CPAP. He does not take it with him when traveling for short trips. He often wakes feeling that mask is uncomfortable. He will take off mask and go back to sleep. He has not noted any improvement in sleep quality when using therapy. His wife has made him continue therapy. He has stopped ordering supplies through [employer] due to billing concerns.     01/07/23 ALL: Marcus Dickson returns for follow up for OSA on CPAP. He reports doing fairly well but has difficulty meeting compliance at times. He continues to travel. He does not like to pack up his machine. He feels that he struggles with air feeling too hot or too cold. He no longer snores. He does not feel as sleepy during the day. He does note medical benefits of using CPAP. He is eligible for a new machine and would like to consider getting a travel machine.     01/01/2022 ALL: Marcus Dickson returns for follow up for OSA on CPAP. He was last seen by Dr Chalice 12/2020 and reported worsening daytime sleepiness and fatigue. Compliance was 70%. AHI well managed. ESS 2-3/24, FSS 23/63. He was encouraged to continue CPAP nightly for at least 4 hours. Since, he does not like using CPAP.  Although he does use therapy nightly, he feels very uncomfortable using CPAP. He travels often for work and does not like transporting CPAP. He is using a FFM. He does have an air leak.   He was started on Ozempic  about 6 months ago. He has been working on healthy lifestyle habits. He is down 13lbs from last visit 12/2020 and 30lbs from sleep study in 2019. He reports highest weight was around 275 in 2018.     09/14/19 ALL (Mychart): Marcus Dickson is a 53 y.o. male here today for follow up for OSa on CPAP. He admits that he has not been as compliant as he should. He continues to have difficulty with an air leak. He also has difficulty finding the correct tempeture for the humidity. He does note increased fatigue when he is not using CPAP.    Compliance report dated 08/11/2019 through 09/09/2019 reveals that he used CPAP 20 one of the last 30 days for compliance of 70%.  He used CPAP greater than 4 hours 15 of the last 30 days for compliance of 50%.  Average usage was 5 hours and 16 minutes.  Residual AHI was 0.9 on 6 to 16 cm of water and an EPR of 3.  There was a significant leak noted in the 95th percentile of 22.2.  09/11/2018 ALL:  Marcus Dickson is a 53 y.o. male here today for follow up.  He is feeling well without complaints today.  His download compliance report shows he is using his CPAP 29 out of 30 days for compliance denies any percent.  He is using his machine 24 out of 30 days for compliance of 80%.  He is using his machine on average 5 hours and 11 minutes.  His AHI was 1.4 on 6 to 16 cm of water with a EPR of 3.  There is no significant leak.  He has not noticed much of a difference in his sleep.  He continues to work different shifts however he has had a conversation with his employer to limit this.  He has been using his machine since 30 May 2018.   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 Nichole for a sleep apnea evaluation.    Chief complaint according to patient : DOT physical. DM, HTN, Obesity.    Sleep habits are as follows: irregular work hours determine irregular sleep times.  He may work between 4 AM and until 10 PM, and he travels a lot with an administrator, sports.  Marcus Dickson is not primarily a driver but his company requires a driver for each work crew.  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    Whenever he can retreat to the bedroom he is asleep in 15 seconds and his wife reports he snores soon after - but not continuously, his wife tries to get him off his back. He can't sleep very long on his sides as his shoulders hurt. The bedroom is dark, cool at 68 F, quiet. He always wakes up at 7.30 AM while on the road or at home. - no matter when he went to bed. Even when not on the road he gets phone calls, but works mostly day shift.  He doesn't wake choking -    Average sleep duration at home 7 hours, on the road - anything possible. He wakes up congested and with a parched mouth, has a lot mucous.     Sleep medical history and family sleep history:  Snoring for several years , as he gained weight - he has lost 30 pounds since 2018 and is able to keep it off.  Retrognathia.  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 Non smoker, ETOH- seldomly .  Caffeine : 2 cups a day of coffee, shift worker - swing shifts.    Observations/Objective:  Generalized: Well developed, in no acute distress  Mentation: Alert oriented to time, place, history taking. Follows all commands speech and language fluent   Assessment and Plan:  53 y.o. year old male  has a past medical history of Diabetes (HCC), Dyslipidemia, High coronary artery calcium  score, Hypertension, Overweight, and Sleep apnea.  here with    ICD-10-CM   1. OSA on CPAP  G47.33 For home use only DME continuous positive airway pressure (CPAP)    2. Complex sleep apnea syndrome  G47.39      Deago continues to do fairly well on CPAP therapy. Compliance report shows acceptable daily use but sub optimal four hour usage. He was encouraged to continue CPAP nightly for at least 4 hours. Supply orders updated. He was encouraged to continue healthy lifestyle  habits. Follow up in 1 year.    Orders Placed This Encounter  Procedures   For home use only DME continuous positive airway pressure (CPAP)    Heated Humidity with all supplies as needed    Length of Need:   Lifetime    Patient has OSA or probable OSA:   Yes    Is the patient currently using CPAP in the home:   Yes    Settings:   Other see comments    CPAP supplies needed:   Mask, headgear, cushions, filters, heated tubing and water chamber    No orders of the defined types were placed in this encounter.    Follow Up Instructions:  I discussed the assessment and treatment plan with the patient. The patient was provided an opportunity to ask questions and all were answered. The patient agreed with the plan and demonstrated an understanding of the instructions.   The patient was advised to call back or seek an in-person evaluation if the symptoms worsen or if the condition fails to improve as anticipated.  I provided 15 minutes of face-to-face and non face-to-face time during this MyChart video encounter. Patient located at their place of residence. Provider is in the office.    Joanne Brander, NP

## 2024-06-02 NOTE — Progress Notes (Unsigned)
 SABRA

## 2024-06-03 ENCOUNTER — Telehealth (INDEPENDENT_AMBULATORY_CARE_PROVIDER_SITE_OTHER): Admitting: Family Medicine

## 2024-06-03 ENCOUNTER — Encounter: Payer: Self-pay | Admitting: Family Medicine

## 2024-06-03 DIAGNOSIS — G4739 Other sleep apnea: Secondary | ICD-10-CM | POA: Diagnosis not present

## 2024-06-03 DIAGNOSIS — G4733 Obstructive sleep apnea (adult) (pediatric): Secondary | ICD-10-CM | POA: Diagnosis not present

## 2024-06-03 NOTE — Patient Instructions (Addendum)

## 2024-06-10 ENCOUNTER — Other Ambulatory Visit (HOSPITAL_COMMUNITY): Payer: Self-pay

## 2024-06-12 DIAGNOSIS — M170 Bilateral primary osteoarthritis of knee: Secondary | ICD-10-CM | POA: Diagnosis not present

## 2024-06-12 DIAGNOSIS — G8929 Other chronic pain: Secondary | ICD-10-CM | POA: Diagnosis not present

## 2024-06-15 DIAGNOSIS — Z125 Encounter for screening for malignant neoplasm of prostate: Secondary | ICD-10-CM | POA: Diagnosis not present

## 2024-06-15 DIAGNOSIS — E785 Hyperlipidemia, unspecified: Secondary | ICD-10-CM | POA: Diagnosis not present

## 2024-06-15 DIAGNOSIS — E1159 Type 2 diabetes mellitus with other circulatory complications: Secondary | ICD-10-CM | POA: Diagnosis not present

## 2024-06-18 ENCOUNTER — Encounter (INDEPENDENT_AMBULATORY_CARE_PROVIDER_SITE_OTHER): Admitting: Ophthalmology

## 2024-06-18 DIAGNOSIS — I10 Essential (primary) hypertension: Secondary | ICD-10-CM

## 2024-06-18 DIAGNOSIS — H35713 Central serous chorioretinopathy, bilateral: Secondary | ICD-10-CM

## 2024-06-18 DIAGNOSIS — H2513 Age-related nuclear cataract, bilateral: Secondary | ICD-10-CM

## 2024-06-18 DIAGNOSIS — H35033 Hypertensive retinopathy, bilateral: Secondary | ICD-10-CM

## 2024-06-18 DIAGNOSIS — H43813 Vitreous degeneration, bilateral: Secondary | ICD-10-CM

## 2024-06-18 DIAGNOSIS — H33301 Unspecified retinal break, right eye: Secondary | ICD-10-CM

## 2024-06-22 DIAGNOSIS — Z1331 Encounter for screening for depression: Secondary | ICD-10-CM | POA: Diagnosis not present

## 2024-06-22 DIAGNOSIS — Z1339 Encounter for screening examination for other mental health and behavioral disorders: Secondary | ICD-10-CM | POA: Diagnosis not present

## 2024-06-22 DIAGNOSIS — Z23 Encounter for immunization: Secondary | ICD-10-CM | POA: Diagnosis not present

## 2024-06-22 DIAGNOSIS — Z0000 Encounter for general adult medical examination without abnormal findings: Secondary | ICD-10-CM | POA: Diagnosis not present

## 2024-06-22 DIAGNOSIS — K760 Fatty (change of) liver, not elsewhere classified: Secondary | ICD-10-CM | POA: Diagnosis not present

## 2024-06-22 DIAGNOSIS — E1159 Type 2 diabetes mellitus with other circulatory complications: Secondary | ICD-10-CM | POA: Diagnosis not present

## 2024-07-15 DIAGNOSIS — K760 Fatty (change of) liver, not elsewhere classified: Secondary | ICD-10-CM | POA: Diagnosis not present

## 2024-09-03 ENCOUNTER — Other Ambulatory Visit: Payer: Self-pay

## 2024-09-03 DIAGNOSIS — I10 Essential (primary) hypertension: Secondary | ICD-10-CM

## 2024-09-17 ENCOUNTER — Ambulatory Visit: Admitting: Neurology

## 2024-11-09 ENCOUNTER — Ambulatory Visit: Admitting: Cardiology

## 2025-06-07 ENCOUNTER — Telehealth: Admitting: Family Medicine
